# Patient Record
Sex: Female | Born: 1950 | Race: Black or African American | Hispanic: No | Smoking: Former smoker
Health system: Southern US, Community
[De-identification: ages and names within clinical notes are randomized; demographics above are authoritative.]

## PROBLEM LIST (undated history)

## (undated) ENCOUNTER — Emergency Department (HOSPITAL_COMMUNITY): Discharge status: Against Medical Advice | Payer: Commercial Managed Care - PPO

## (undated) DIAGNOSIS — I509 Heart failure, unspecified: Secondary | ICD-10-CM

## (undated) DIAGNOSIS — K5792 Diverticulitis of intestine, part unspecified, without perforation or abscess without bleeding: Secondary | ICD-10-CM

## (undated) DIAGNOSIS — N189 Chronic kidney disease, unspecified: Secondary | ICD-10-CM

## (undated) DIAGNOSIS — D649 Anemia, unspecified: Secondary | ICD-10-CM

## (undated) DIAGNOSIS — K219 Gastro-esophageal reflux disease without esophagitis: Secondary | ICD-10-CM

## (undated) DIAGNOSIS — I739 Peripheral vascular disease, unspecified: Secondary | ICD-10-CM

## (undated) DIAGNOSIS — M1990 Unspecified osteoarthritis, unspecified site: Secondary | ICD-10-CM

## (undated) DIAGNOSIS — K5900 Constipation, unspecified: Secondary | ICD-10-CM

## (undated) DIAGNOSIS — K5790 Diverticulosis of intestine, part unspecified, without perforation or abscess without bleeding: Secondary | ICD-10-CM

## (undated) DIAGNOSIS — I10 Essential (primary) hypertension: Secondary | ICD-10-CM

## (undated) HISTORY — PX: HERNIA REPAIR: SHX51

## (undated) HISTORY — PX: TUBAL LIGATION: SHX77

## (undated) HISTORY — DX: Anemia, unspecified: D64.9

## (undated) HISTORY — PX: OTHER SURGICAL HISTORY: SHX169

## (undated) HISTORY — PX: CARDIAC CATHETERIZATION: SHX172

## (undated) HISTORY — DX: Unspecified osteoarthritis, unspecified site: M19.90

## (undated) HISTORY — DX: Diverticulitis of intestine, part unspecified, without perforation or abscess without bleeding: K57.92

## (undated) HISTORY — DX: Diverticulosis of intestine, part unspecified, without perforation or abscess without bleeding: K57.90

## (undated) HISTORY — PX: POLYPECTOMY: SHX149

## (undated) HISTORY — PX: ABDOMINAL HYSTERECTOMY: SHX81

## (undated) HISTORY — PX: COLONOSCOPY: SHX174

## (undated) HISTORY — DX: Chronic kidney disease, unspecified: N18.9

## (undated) HISTORY — DX: Constipation, unspecified: K59.00

## (undated) HISTORY — DX: Heart failure, unspecified: I50.9

---

## 1999-03-21 ENCOUNTER — Emergency Department (HOSPITAL_COMMUNITY): Admission: EM | Admit: 1999-03-21 | Discharge: 1999-03-21 | Payer: Self-pay | Admitting: Emergency Medicine

## 2000-04-20 ENCOUNTER — Emergency Department (HOSPITAL_COMMUNITY): Admission: EM | Admit: 2000-04-20 | Discharge: 2000-04-20 | Payer: Self-pay | Admitting: Emergency Medicine

## 2000-05-06 ENCOUNTER — Ambulatory Visit (HOSPITAL_COMMUNITY): Admission: RE | Admit: 2000-05-06 | Discharge: 2000-05-06 | Payer: Self-pay | Admitting: Internal Medicine

## 2000-05-06 ENCOUNTER — Encounter: Payer: Self-pay | Admitting: Internal Medicine

## 2000-05-10 ENCOUNTER — Encounter: Admission: RE | Admit: 2000-05-10 | Discharge: 2000-05-10 | Payer: Self-pay | Admitting: Internal Medicine

## 2000-05-10 ENCOUNTER — Encounter: Payer: Self-pay | Admitting: Internal Medicine

## 2002-07-11 ENCOUNTER — Encounter: Payer: Self-pay | Admitting: Internal Medicine

## 2002-07-11 ENCOUNTER — Encounter: Admission: RE | Admit: 2002-07-11 | Discharge: 2002-07-11 | Payer: Self-pay | Admitting: Internal Medicine

## 2003-05-15 ENCOUNTER — Emergency Department (HOSPITAL_COMMUNITY): Admission: EM | Admit: 2003-05-15 | Discharge: 2003-05-15 | Payer: Self-pay | Admitting: Emergency Medicine

## 2003-05-15 ENCOUNTER — Encounter: Payer: Self-pay | Admitting: Emergency Medicine

## 2004-01-08 ENCOUNTER — Emergency Department (HOSPITAL_COMMUNITY): Admission: EM | Admit: 2004-01-08 | Discharge: 2004-01-08 | Payer: Self-pay | Admitting: Emergency Medicine

## 2004-04-09 ENCOUNTER — Emergency Department (HOSPITAL_COMMUNITY): Admission: EM | Admit: 2004-04-09 | Discharge: 2004-04-10 | Payer: Self-pay | Admitting: Emergency Medicine

## 2004-09-26 ENCOUNTER — Emergency Department (HOSPITAL_COMMUNITY): Admission: EM | Admit: 2004-09-26 | Discharge: 2004-09-26 | Payer: Self-pay | Admitting: Family Medicine

## 2005-04-09 ENCOUNTER — Emergency Department (HOSPITAL_COMMUNITY): Admission: EM | Admit: 2005-04-09 | Discharge: 2005-04-09 | Payer: Self-pay | Admitting: Emergency Medicine

## 2005-08-10 ENCOUNTER — Emergency Department (HOSPITAL_COMMUNITY): Admission: EM | Admit: 2005-08-10 | Discharge: 2005-08-10 | Payer: Self-pay | Admitting: Emergency Medicine

## 2007-01-11 ENCOUNTER — Emergency Department (HOSPITAL_COMMUNITY): Admission: EM | Admit: 2007-01-11 | Discharge: 2007-01-11 | Payer: Self-pay | Admitting: Emergency Medicine

## 2007-08-18 ENCOUNTER — Emergency Department (HOSPITAL_COMMUNITY): Admission: EM | Admit: 2007-08-18 | Discharge: 2007-08-18 | Payer: Self-pay | Admitting: Family Medicine

## 2008-05-21 ENCOUNTER — Emergency Department (HOSPITAL_COMMUNITY): Admission: EM | Admit: 2008-05-21 | Discharge: 2008-05-21 | Payer: Self-pay | Admitting: Family Medicine

## 2008-07-24 ENCOUNTER — Ambulatory Visit: Payer: Self-pay | Admitting: Family Medicine

## 2008-07-24 ENCOUNTER — Ambulatory Visit: Payer: Self-pay | Admitting: Internal Medicine

## 2008-07-24 DIAGNOSIS — E785 Hyperlipidemia, unspecified: Secondary | ICD-10-CM

## 2008-07-24 DIAGNOSIS — Z8719 Personal history of other diseases of the digestive system: Secondary | ICD-10-CM

## 2008-07-24 DIAGNOSIS — I10 Essential (primary) hypertension: Secondary | ICD-10-CM | POA: Insufficient documentation

## 2008-07-24 DIAGNOSIS — K5730 Diverticulosis of large intestine without perforation or abscess without bleeding: Secondary | ICD-10-CM | POA: Insufficient documentation

## 2008-07-24 DIAGNOSIS — F3289 Other specified depressive episodes: Secondary | ICD-10-CM | POA: Insufficient documentation

## 2008-07-24 DIAGNOSIS — R079 Chest pain, unspecified: Secondary | ICD-10-CM

## 2008-07-24 DIAGNOSIS — D509 Iron deficiency anemia, unspecified: Secondary | ICD-10-CM

## 2008-07-24 DIAGNOSIS — F329 Major depressive disorder, single episode, unspecified: Secondary | ICD-10-CM

## 2008-07-24 DIAGNOSIS — R9431 Abnormal electrocardiogram [ECG] [EKG]: Secondary | ICD-10-CM

## 2008-07-24 DIAGNOSIS — R21 Rash and other nonspecific skin eruption: Secondary | ICD-10-CM

## 2008-07-24 LAB — CONVERTED CEMR LAB
ALT: 22 units/L (ref 0–35)
AST: 18 units/L (ref 0–37)
Albumin: 4 g/dL (ref 3.5–5.2)
BUN: 13 mg/dL (ref 6–23)
Basophils Absolute: 0.1 10*3/uL (ref 0.0–0.1)
Bilirubin Urine: NEGATIVE
Bilirubin, Direct: 0.1 mg/dL (ref 0.0–0.3)
CO2: 30 meq/L (ref 19–32)
Calcium: 9.6 mg/dL (ref 8.4–10.5)
Chloride: 107 meq/L (ref 96–112)
Creatinine, Ser: 1.1 mg/dL (ref 0.4–1.2)
Eosinophils Absolute: 0.2 10*3/uL (ref 0.0–0.7)
Eosinophils Relative: 3.3 % (ref 0.0–5.0)
GFR calc non Af Amer: 54 mL/min
HCT: 42.6 % (ref 36.0–46.0)
Hemoglobin: 14.5 g/dL (ref 12.0–15.0)
Ketones, ur: NEGATIVE mg/dL
Lymphocytes Relative: 34.2 % (ref 12.0–46.0)
MCHC: 33.9 g/dL (ref 30.0–36.0)
MCV: 90.6 fL (ref 78.0–100.0)
Monocytes Absolute: 0.2 10*3/uL (ref 0.1–1.0)
Neutro Abs: 3.7 10*3/uL (ref 1.4–7.7)
Nitrite: NEGATIVE
Platelets: 238 10*3/uL (ref 150–400)
Potassium: 4.7 meq/L (ref 3.5–5.1)
RBC: 4.71 M/uL (ref 3.87–5.11)
TSH: 1.23 microintl units/mL (ref 0.35–5.50)
Total Bilirubin: 0.7 mg/dL (ref 0.3–1.2)
Total Protein: 7.5 g/dL (ref 6.0–8.3)
Triglycerides: 103 mg/dL (ref 0–149)
VLDL: 21 mg/dL (ref 0–40)
pH: 6 (ref 5.0–8.0)

## 2008-08-01 ENCOUNTER — Ambulatory Visit: Payer: Self-pay | Admitting: Internal Medicine

## 2008-08-01 ENCOUNTER — Inpatient Hospital Stay (HOSPITAL_COMMUNITY): Admission: EM | Admit: 2008-08-01 | Discharge: 2008-08-02 | Payer: Self-pay | Admitting: *Deleted

## 2008-08-01 ENCOUNTER — Ambulatory Visit: Payer: Self-pay | Admitting: Cardiology

## 2008-08-05 ENCOUNTER — Ambulatory Visit: Payer: Self-pay | Admitting: Internal Medicine

## 2008-08-05 DIAGNOSIS — K219 Gastro-esophageal reflux disease without esophagitis: Secondary | ICD-10-CM | POA: Insufficient documentation

## 2008-08-05 DIAGNOSIS — R609 Edema, unspecified: Secondary | ICD-10-CM

## 2009-01-21 ENCOUNTER — Ambulatory Visit: Payer: Self-pay | Admitting: Internal Medicine

## 2009-01-21 DIAGNOSIS — M549 Dorsalgia, unspecified: Secondary | ICD-10-CM | POA: Insufficient documentation

## 2009-02-27 ENCOUNTER — Ambulatory Visit: Payer: Self-pay | Admitting: Gastroenterology

## 2009-02-27 DIAGNOSIS — K5900 Constipation, unspecified: Secondary | ICD-10-CM | POA: Insufficient documentation

## 2009-02-27 DIAGNOSIS — K922 Gastrointestinal hemorrhage, unspecified: Secondary | ICD-10-CM | POA: Insufficient documentation

## 2009-02-27 DIAGNOSIS — D126 Benign neoplasm of colon, unspecified: Secondary | ICD-10-CM

## 2009-02-27 HISTORY — DX: Benign neoplasm of colon, unspecified: D12.6

## 2009-03-26 ENCOUNTER — Ambulatory Visit: Payer: Self-pay | Admitting: Gastroenterology

## 2009-03-26 ENCOUNTER — Encounter: Payer: Self-pay | Admitting: Gastroenterology

## 2009-03-28 ENCOUNTER — Encounter: Payer: Self-pay | Admitting: Gastroenterology

## 2009-10-11 ENCOUNTER — Emergency Department (HOSPITAL_COMMUNITY): Admission: EM | Admit: 2009-10-11 | Discharge: 2009-10-11 | Payer: Self-pay | Admitting: Family Medicine

## 2011-01-12 NOTE — H&P (Signed)
NAMEBUFFEY, ZABINSKI             ACCOUNT NO.:  [id number]   MEDICAL RECORD NO.:  [id number]          PATIENT TYPE:  INP   LOCATION:  0101                         FACILITY:  [facility]   PHYSICIAN:  Michiel Cowboy, MDDATE OF BIRTH:  [birth year]   DATE OF ADMISSION:  07/31/2008  DATE OF DISCHARGE:                              HISTORY & PHYSICAL   PRIMARY CARE Toshi Ishii:  Dr. Jonny Ruiz.   CHIEF COMPLAINT:  Chest pain.   The patient is an 60 year old female with past medical history of  hyperlipidemia and tobacco abuse and family history of coronary artery  disease in both mother and father, who for the past few months has been  having worsening fatigue when she walks around and sometimes feels  presyncopal.  Her primary care doctor was going to get her set up for a  stress test.  She was supposed to have a stress test on August 11, 2008, but today when driving to work she noticed sudden chest pain which  radiated to her left neck and left arm.  She was not short of breath,  did not have palpitations.  No nausea, no vomiting, no diaphoresis.  This happened at rest.  She presented to the emergency department and  here was relieved with nitro and morphine, unsure which one actually  helped.  She did have some indigestion associated with this.  She never  really had a similar episode before.  Currently, she is chest pain free.  Her cardiac markers are unremarkable and EKG unremarkable, as well as  her chest x-ray.  [facility] hospitalist was called for further admission.   PAST MEDICAL HISTORY:  Significant for hyperlipidemia.   REVIEW OF SYSTEMS:  Negative except for as per HPI, otherwise 12 systems  were reviewed and negative.   SOCIAL HISTORY:  The patient still continues to smoke.  Does not use  drugs, does not drink alcohol.  Her work involves a lot of physical  activity.   FAMILY HISTORY:  Significant for mother in her 73s with coronary artery  disease and father died of a massive  heart attack at 59.  Of note, the  patient is 26 is now.   PHYSICAL EXAMINATION:  VITAL SIGNS:  Temperature 97.7, blood pressure  145/95, after nitro 111/76, heart rate 80, now down to 68, respirations  16, satting 96% on room air.  GENERAL:  The patient appears to be in no acute distress, obese female  laying down in bed.  HEENT:  Head nontraumatic.  Moist mucous membranes.  LUNGS:  Clear to auscultation bilaterally but somewhat distant breath  sounds and somewhat coarse.  HEART:  Regular rate and rhythm.  No  murmurs appreciated.  ABDOMEN:  Soft, nontender, nondistended.  Somewhat obese.  LOWER EXTREMITIES:  No clubbing, cyanosis or edema.  Cranial nerves II  through XII intact.  Strength 5/5 in all for extremities.   LABORATORY DATA:  White blood cell count 8.0, hemoglobin 12.8, sodium  140, potassium 3.8, creatinine 1.2, magnesium 2.2.  Cardiac enzymes  within normal limits.  D-dimer 0.28.  EKG showing slightly peaked T-  waves in  lead II, but otherwise unremarkable in normal sinus rhythm.  Chest x-ray showing no cardiopulmonary disease, but this is age advanced  arthrosclerosis.   ASSESSMENT/PLAN:  This is a 60 year old female with history of  hyperlipidemia, tobacco abuse and family history of coronary artery  disease who presents with fairly typical chest pain.  Will admit on  telemetry, cycle cardiac enzymes.  D-dimer is negative.  Obtain further  risk stratify, fasting lipid panel, hemoglobin A1c and TSH.  Would  recommend cardiology consult in a.m. and possibly getting her stress  test done sooner or later.  Will continue aspirin.  Continue statin.  If  cardiac markers are elevated will start on heparin, but currently chest  pain free and very stable.   1. History of hyperlipidemia.  Continue statin, check fasting lipid      panel.  2. Prophylaxis.  Protonix and Lovenox.  3. Tobacco abuse.  Consult on quitting.  The patient seemed      interested.      Michiel Cowboy, MD  Electronically Signed     AVD/MEDQ  D:  08/01/2008  T:  08/01/2008  Job:  [id number]   cc:   Jonny Ruiz, MD

## 2011-01-12 NOTE — Consult Note (Signed)
Kristy Baxter, GRANDMAISON             ACCOUNT NO.:  [id number]   MEDICAL RECORD NO.:  [id number]          PATIENT TYPE:  INP   LOCATION:  0101                         FACILITY:  [facility]   PHYSICIAN:  Marca Ancona, MD      DATE OF BIRTH:  11/04/1950   DATE OF CONSULTATION:  08/01/2008  DATE OF DISCHARGE:                                 CONSULTATION   REQUESTING PHYSICIAN:  Dr. Rene Paci.   PRIMARY CARDIOLOGIST:  Dr. Oliver Barre.   PRIMARY CARDIOLOGIST:  Primary cardiologist would be new; Dr. Marca Ancona.   REASON FOR CONSULTATION:  Chest pain.   HISTORY OF PRESENT ILLNESS:  This is a 60 year old African American  female without prior history of CAD who has been recently diagnosed  within the last month with hypercholesterolemia who has been newly  established with Dr. Oliver Barre within this last month who had some  complaints of chest discomfort with associated diaphoresis while at work  on Saturday evening approximately 4 days ago.  The patient said she felt  it when she was walking and having substernal chest pressure that felt  like someone standing on me lasting seconds.  The patient states that  the pain went away with rest.  She also has been complaining of 3-4  months of increasing fatigue and lack of energy.  Last night while at  work the patient states that she had severe chest pressure with  radiation to the left shoulder and arm lasting approximately 2 hours  with increasing intensity.  The patient drove herself to the emergency  room, and was given sublingual nitroglycerin and aspirin and the pain  got some better and morphine relieved it completely.  The patient states  that when she saw Dr. Oliver Barre approximately 3 weeks ago she was  scheduled for a stress Myoview and an echocardiogram on August 12, 2008 secondary to some fatigue and recurrence of discomfort, although it  was not as intense as occurred last night.  The patient is now being  seen in the  emergency room for these issues.  Of note, the patient's  cardiac enzymes were found to be negative x2.  Her EKG was negative for  any acute changes.   REVIEW OF SYSTEMS:  Positive for complaints of lower extremity edema,  positive chest pain, diaphoresis not always with chest pain, but  associated with fatigue, weakness, and complaints of intermittent  claudication.   PAST MEDICAL HISTORY:  Newly diagnosed within this last month with  hypercholesterolemia.   PAST SURGICAL HISTORY:  Hysterectomy.   SOCIAL HISTORY:  Positive for tobacco, 1/3 pack a day for 25 years,  negative for EtOH.  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   FAMILY HISTORY:  Father deceased of an MI at age 62.  Mother with  cardiac issues to include CHF.  She is currently living.  She has 1  sister with lung cancer.   CURRENT MEDICATIONS:  1. Protonix 40 mg daily.  2. Simvastatin 80 mg daily.  3. Aspirin 81 mg daily.  4. Low molecular weight heparin.  5. Morphine p.r.n.   ALLERGIES:  1. SULFA.  2. BACTRIM.   Chest x-ray revealing no acute cardiopulmonary process.   CURRENT LABORATORIES:  Sodium 143, potassium 4.0, chloride 108, CO2 29,  BUN 13, creatinine 1.2, glucose 92.  D-dimer less than 0.28. Hemoglobin  12.0, hematocrit 36.6, white blood cells 6.7, platelets 243.  Total  cholesterol 187, total triglycerides 65, HDL 36, LDL 136.  PT 12.8, INR  1.0.  Troponin less than 0.01 and 0.01, respectively.   EKG on admission:  Sinus rhythm, ventricular rate of 80 beats per  minute.  This a.m. it is sinus brady with ventricular rate of 56 beats  per minute with no acute changes at present.   PHYSICAL EXAMINATION:  She is awake, alert, oriented, no acute distress.  She is pain free.  HEENT:  Head is normocephalic and atraumatic.  EYES:  PERRLA.  Mucous membranes and mouth are pink and moist.  Tongue  is midline.  NECK:  Supple.  There is no JVD.  No carotid bruits appreciated.   CARDIOVASCULAR:  Regular rate and rhythm with 1/6 systolic murmur  auscultated.  LUNGS:  Clear to auscultation without wheezes, rales or rhonchi.  ABDOMEN:  Soft, nontender, 2+ bowel sounds.  The patient has no lower extremity edema.  Dorsalis pedis pulses are 1+  bilaterally.  SKIN:  Warm and dry.  VITAL SIGNS:  Blood pressure 125/79, heart rate 68, respirations 15,  temperature 98.4, O2 sat 98% on room air.   IMPRESSION:  1. Chest pain worrisome for unstable angina.  Negative EKG and cardiac      enzymes.  The patient has multiple cardiovascular risk factors to      include smoking, family history, hypercholesterolemia, and      hypertension.  2. Recent diagnosis of hyperlipidemia.   PLAN:  After lengthy discussion with Dr. Marca Ancona, it has been  advised that the patient be transferred to [facility] for  cardiac catheterization in this setting.  This has been explained to the  patient who  verbalizes understanding.  Both risks and benefits have been discussed,  and she wishes to proceed.  At this time we will plan  transfer to [facility] for catheterization making further recommendations  depending upon results of cath and patient's response to treatment.      Bettey Mare. Lyman Bishop, NP      Marca Ancona, MD  Electronically Signed    KML/MEDQ  D:  08/01/2008  T:  08/01/2008  Job:  [id number]   cc:   Vikki Ports A. Felicity Coyer, MD  [address] [place], [place] [address]   Corwin Levins, MD  [address]  [place]  [place] [address]

## 2011-01-12 NOTE — Cardiovascular Report (Signed)
Kristy Baxter, Kristy Baxter             ACCOUNT NO.:  [id number]   MEDICAL RECORD NO.:  [id number]          PATIENT TYPE:  OUT   LOCATION:  CATH                         FACILITY:  [facility]   PHYSICIAN:  Verne Carrow, MDDATE OF BIRTH:  Oct 08, 1950   DATE OF PROCEDURE:  08/01/2008  DATE OF DISCHARGE:                            CARDIAC CATHETERIZATION   PROCEDURES PERFORMED:  1. Left heart catheterization.  2. Selective coronary angiography.  3. Left ventricular angiogram.   OPERATOR:  Verne Carrow, MD   INDICATIONS FOR PROCEDURE:  Chest pain in a patient with history of  tobacco abuse and hyperlipidemia.   DETAILS OF PROCEDURE:  The patient was brought to the inpatient cardiac  catheterization laboratory at [facility] after she was  transferred from [facility] for the procedure.  The patient  signed informed consent for the procedure prior to arriving in the cath  lab.  The right groin was prepped and draped in a sterile fashion.  A 1%  lidocaine was used for local anesthesia.  A 5-French sheath was inserted  into the right femoral artery without difficulty.  A 5-French JL-4  diagnostic catheter was used to selectively engage and inject the left  coronary system.  A 5-French JR-4 diagnostic catheter was used to  selectively engage and inject the right coronary artery.  A 5-French  pigtail catheter was used to cross the aortic valve into the left  ventricle.  Following the performance of the left ventricular angiogram,  the pigtail catheter was pulled back across the aortic valve with no  significant pressure gradient measured.  The patient tolerated the  procedure well.  I did perform an angiogram of her right femoral artery  following the procedure and found that the arterial stick was in an  appropriate location for a femoral artery closure device.  An Angio-Seal  femoral artery closure device was placed without difficulty in the right  femoral  artery.   ANGIOGRAPHIC FINDINGS:  The left main coronary artery bifurcated into  the circumflex and LAD.  There was no evidence of disease in the left  main system.  The left anterior descending coronary artery is a large  vessel that courses to the apex and gives off several diagonal branches  as well as a large septal perforator.  There is no angiographic evidence  of disease in the left anterior descending system.  The circumflex  artery is a large vessel that gives off a small first obtuse marginal  branch followed by a large branching second obtuse marginal and a  smaller third obtuse marginal.  There is no angiographic evidence of  disease in this system.  The right coronary artery is a large dominant  artery that gives off a posterior descending segment as well as a small  posterolateral branch.  There is no angiographic evidence of disease in  this system.   The left ventricular angiogram showed normal left ventricular systolic  function with no wall motion abnormalities.  Ejection fraction was  estimated at 55-60%.   HEMODYNAMIC DATA:  Left ventricular pressure 154/60, end-diastolic  pressure 12, central aortic pressure 151/79.  IMPRESSION:  1. No angiographic evidence of coronary artery disease.  2. Normal left ventricular systolic function.   RECOMMENDATIONS:  No further cardiac workup of chest pain.  The patient  will be transferred back to the [facility] for further care.      Verne Carrow, MD  Electronically Signed     CM/MEDQ  D:  08/01/2008  T:  08/02/2008  Job:  [id number]

## 2011-01-15 NOTE — Discharge Summary (Signed)
NAMEVIVI, Kristy Baxter             ACCOUNT NO.:  [id number]   MEDICAL RECORD NO.:  [id number]          PATIENT TYPE:  INP   LOCATION:  1409                         [facility]ACILITY:  [facility]   PHYSICIAN:  Valerie A. Felicity Coyer, MDDATE OF BIRTH:  [birth year]   DATE OF ADMISSION:  07/31/2008  DATE OF DISCHARGE:  08/02/2008                               DISCHARGE SUMMARY   DISCHARGE DIAGNOSES:  1. Chest pain with negative cardiac catheterization this admission.  2. Borderline hypertension.  3. Hypercholesterolemia.   HISTORY OF PRESENT ILLNESS:  The patient is a 60 year old female with  past medical history of hyperlipidemia and tobacco abuse who presented  to [facility] emergency room on day of admission with worsening fatigue  times several months being followed by her primary care physician.  On  the day of admission the patient reports the sudden onset of chest pain  radiating to her left neck and left arm.  She denied any associated  shortness of breath, nausea, vomiting or diaphoresis.  Again, the  symptoms occurred while the patient was at rest.  Given risk factors for  coronary artery disease the patient was admitted for further evaluation  and treatment.   PAST MEDICAL HISTORY:  1. Hyperlipidemia.  2. Current tobacco abuse.   CONSULTATIONS DURING THIS ADMISSION:  [place] cardiology.   PROCEDURES DURING THIS HOSPITALIZATION:  1. Cardiac catheterization done August 01, 2008 with no evidence of      coronary artery disease, normal left ventricular function.   COURSE OF HOSPITALIZATION:  1. Atypical chest pain.  Again, given the patient's risk factors      including hyperlipidemia, current tobacco abuse as well as early      onset coronary artery disease in both mother and father, cardiology      was asked to see the patient in consultation.  The patient did      undergo cardiac catheterization on August 01, 2008 with no      evidence of coronary artery disease and normal LV  systolic      function.  Please see complete cardiology consultation note for      further details.  No further cardiac workup of the patient's chest      pain is planned.  2. In regards to borderline hypertension throughout hospitalization we      will defer to the patient's primary care physician at follow-up to      determine need for medical management.   MEDICATIONS AT TIME OF DISCHARGE:  1. Aspirin 81 mg p.o. daily.  2. Simvastatin 80 mg p.o. daily.  3. Atrovent 1 tablet p.o. daily.   DISPOSITION:  The patient is felt medically stable for discharge home at  this time as cardiac catheterization within normal limits with no signs  of coronary artery disease.  The patient is instructed to follow up with  her primary care physician, Dr. Oliver Barre, on Monday, December 7 at  1:38 p.m. at which time blood pressure to be reassessed to determine  need for medication.      Cordelia Pen, NP      Raenette Rover. Felicity Coyer,  MD  Electronically Signed    LE/MEDQ  D:  09/11/2008  T:  09/12/2008  Job:  [id number]   cc:   Corwin Levins, MD  [address]  [place]  [place] [address]   Jearld Pies, Dr.

## 2011-05-31 LAB — POCT I-STAT, CHEM 8
BUN: 17
Chloride: 106
Creatinine, Ser: 1.3 — ABNORMAL HIGH
Hemoglobin: 15.3 — ABNORMAL HIGH
Potassium: 4.6

## 2011-06-04 ENCOUNTER — Inpatient Hospital Stay (INDEPENDENT_AMBULATORY_CARE_PROVIDER_SITE_OTHER)
Admission: RE | Admit: 2011-06-04 | Discharge: 2011-06-04 | Disposition: A | Discharge status: Home / Self Care | Payer: Commercial Managed Care - PPO | Source: Ambulatory Visit | Attending: Emergency Medicine | Admitting: Emergency Medicine

## 2011-06-04 DIAGNOSIS — M79609 Pain in unspecified limb: Secondary | ICD-10-CM

## 2011-06-04 LAB — LIPID PANEL
HDL: 36 mg/dL — ABNORMAL LOW (ref >39)
Total CHOL/HDL Ratio: 5.2 RATIO
Triglycerides: 65 mg/dL (ref <150)
VLDL: 13 mg/dL (ref 0–40)

## 2011-06-04 LAB — CARDIAC PANEL(CRET KIN+CKTOT+MB+TROPI)
CK, MB: 1.3 ng/mL (ref 0.3–4.0)
CK, MB: 1.5 ng/mL (ref 0.3–4.0)
CK, MB: 1.6 ng/mL (ref 0.3–4.0)
Relative Index: INVALID (ref 0.0–2.5)
Troponin I: 0.01 ng/mL (ref 0.00–0.06)
Troponin I: 0.02 ng/mL (ref 0.00–0.06)
Troponin I: 0.04 ng/mL (ref 0.00–0.06)

## 2011-06-04 LAB — BASIC METABOLIC PANEL
BUN: 13 mg/dL (ref 6–23)
BUN: 14 mg/dL (ref 6–23)
CO2: 25 mEq/L (ref 19–32)
Chloride: 108 mEq/L (ref 96–112)
Creatinine, Ser: 1.17 mg/dL (ref 0.4–1.2)
Creatinine, Ser: 1.19 mg/dL (ref 0.4–1.2)
Glucose, Bld: 101 mg/dL — ABNORMAL HIGH (ref 70–99)

## 2011-06-04 LAB — CBC
HCT: 38.9 % (ref 36.0–46.0)
Hemoglobin: 12.8 g/dL (ref 12.0–15.0)
MCHC: 32.7 g/dL (ref 30.0–36.0)
MCV: 90.5 fL (ref 78.0–100.0)
Platelets: 257 10*3/uL (ref 150–400)
RBC: 4.3 MIL/uL (ref 3.87–5.11)
RDW: 14.2 % (ref 11.5–15.5)
WBC: 6.2 10*3/uL (ref 4.0–10.5)

## 2011-06-04 LAB — COMPREHENSIVE METABOLIC PANEL
Albumin: 3.5 g/dL (ref 3.5–5.2)
BUN: 13 mg/dL (ref 6–23)
CO2: 29 mEq/L (ref 19–32)
Calcium: 9 mg/dL (ref 8.4–10.5)
Creatinine, Ser: 1.2 mg/dL (ref 0.4–1.2)
Glucose, Bld: 92 mg/dL (ref 70–99)
Sodium: 142 mEq/L (ref 135–145)
Total Bilirubin: 0.6 mg/dL (ref 0.3–1.2)

## 2011-06-04 LAB — TSH: TSH: 1.778 u[IU]/mL (ref 0.350–4.500)

## 2011-06-04 LAB — CK TOTAL AND CKMB (NOT AT ARMC)
CK, MB: 1.7 ng/mL (ref 0.3–4.0)
Total CK: 27 U/L (ref 7–177)

## 2011-06-04 LAB — APTT: aPTT: 28 seconds (ref 24–37)

## 2011-06-04 LAB — PROTIME-INR
INR: 0.9 (ref 0.00–1.49)
Prothrombin Time: 12.8 seconds (ref 11.6–15.2)

## 2014-01-28 ENCOUNTER — Emergency Department (HOSPITAL_COMMUNITY)
Admission: EM | Admit: 2014-01-28 | Discharge: 2014-01-28 | Discharge status: Against Medical Advice | Payer: Commercial Managed Care - PPO

## 2014-10-23 ENCOUNTER — Emergency Department (INDEPENDENT_AMBULATORY_CARE_PROVIDER_SITE_OTHER)
Admission: EM | Admit: 2014-10-23 | Discharge: 2014-10-23 | Disposition: A | Discharge status: Home / Self Care | Payer: Medicare Other | Source: Home / Self Care | Attending: Family Medicine | Admitting: Family Medicine

## 2014-10-23 ENCOUNTER — Encounter (HOSPITAL_COMMUNITY): Payer: Self-pay | Admitting: Emergency Medicine

## 2014-10-23 DIAGNOSIS — A084 Viral intestinal infection, unspecified: Secondary | ICD-10-CM | POA: Diagnosis not present

## 2014-10-23 MED ORDER — ONDANSETRON HCL 4 MG PO TABS: 4.0000 mg | ORAL_TABLET | Freq: Four times a day (QID) | ORAL | Status: DC

## 2014-10-23 NOTE — Discharge Instructions (Signed)
Viral Gastroenteritis Do not stop her diarrhea. As long and is continuing to lessen the allow her body to shed the virus. Zofran for nausea. Drink clear liquids in small frequent amounts. No heavy or solid foods for the next couple days. Viral gastroenteritis is also called stomach flu. This illness is caused by a certain type of germ (virus). It can cause sudden watery poop (diarrhea) and throwing up (vomiting). This can cause you to lose body fluids (dehydration). This illness usually lasts for 3 to 8 days. It usually goes away on its own. HOME CARE   Drink enough fluids to keep your pee (urine) clear or pale yellow. Drink small amounts of fluids often.  Ask your doctor how to replace body fluid losses (rehydration).  Avoid:  Foods high in sugar.  Alcohol.  Bubbly (carbonated) drinks.  Tobacco.  Juice.  Caffeine drinks.  Very hot or cold fluids.  Fatty, greasy foods.  Eating too much at one time.  Dairy products until 24 to 48 hours after your watery poop stops.  You may eat foods with active cultures (probiotics). They can be found in some yogurts and supplements.  Wash your hands well to avoid spreading the illness.  Only take medicines as told by your doctor. Do not give aspirin to children. Do not take medicines for watery poop (antidiarrheals).  Ask your doctor if you should keep taking your regular medicines.  Keep all doctor visits as told. GET HELP RIGHT AWAY IF:   You cannot keep fluids down.  You do not pee at least once every 6 to 8 hours.  You are short of breath.  You see blood in your poop or throw up. This may look like coffee grounds.  You have belly (abdominal) pain that gets worse or is just in one small spot (localized).  You keep throwing up or having watery poop.  You have a fever.  The patient is a child younger than 3 months, and he or she has a fever.  The patient is a child older than 3 months, and he or she has a fever and problems  that do not go away.  The patient is a child older than 3 months, and he or she has a fever and problems that suddenly get worse.  The patient is a baby, and he or she has no tears when crying. MAKE SURE YOU:   Understand these instructions.  Will watch your condition.  Will get help right away if you are not doing well or get worse. Document Released: 02/02/2008 Document Revised: 11/08/2011 Document Reviewed: 06/02/2011 [facility] Patient Information 2015 [place], [place]. This information is not intended to replace advice given to you by your health care provider. Make sure you discuss any questions you have with your health care provider.

## 2014-10-23 NOTE — ED Notes (Signed)
C/o abd pain onset 2 days Sx include intermittent sharp pains, n/v/d, chills Denies fevers, urinary sx Drinking gingerale w/some relief.  Alert, no signs of acute distress.

## 2014-10-23 NOTE — ED Provider Notes (Signed)
CSN: [id number]     Arrival date & time 10/23/14  1409 History   First MD Initiated Contact with Patient 10/23/14 1541     Chief Complaint  Patient presents with  . Abdominal Pain   (Consider location/radiation/quality/duration/timing/severity/associated sxs/prior Treatment) HPI Comments: 64 year old female states that for today she has been having intermittent lower abdominal cramping. After she had cramping this would be followed by diarrhea which would temporarily relieved the cramping. It is intermittent. And today she feels a little better. In addition she has had nausea but no vomiting. She has been able to drink clear liquids without vomiting. Denies fever, headache, neck pain, chest pain or shortness of breath.   History reviewed. No pertinent past medical history. History reviewed. No pertinent past surgical history. No family history on file. History  Substance Use Topics  . Smoking status: Current Every Day Smoker  . Smokeless tobacco: Not on file  . Alcohol Use: No   OB History    No data available     Review of Systems  Constitutional: Positive for activity change and appetite change. Negative for fever.  HENT: Negative.   Respiratory: Negative.   Cardiovascular: Negative.   Gastrointestinal: Positive for nausea, abdominal pain and diarrhea. Negative for vomiting, constipation and abdominal distention.  Genitourinary: Negative.   Skin: Negative.   Neurological: Negative.     Allergies  Sulfonamide derivatives  Home Medications   Prior to Admission medications   Medication Sig Start Date End Date Taking? Authorizing Provider  HYDROcodone-acetaminophen (NORCO) 10-325 MG per tablet Take 1 tablet by mouth every 6 (six) hours as needed.   Yes Historical Provider, MD  ondansetron (ZOFRAN) 4 MG tablet Take 1 tablet (4 mg total) by mouth every 6 (six) hours. 10/23/14   Janne Napoleon, NP   BP 125/90 mmHg  Pulse 59  Temp(Src) 97.9 F (36.6 C) (Oral)  Resp 12 Physical  Exam  Constitutional: She is oriented to person, place, and time. She appears well-developed and well-nourished. No distress.  HENT:  Mouth/Throat: Oropharynx is clear and moist. No oropharyngeal exudate.  Eyes: Conjunctivae and EOM are normal.  Neck: Normal range of motion. Neck supple.  Cardiovascular: Normal rate, regular rhythm and normal heart sounds.   Pulmonary/Chest: Effort normal and breath sounds normal. No respiratory distress. She has no wheezes. She has no rales.  Abdominal: Soft. Bowel sounds are normal. She exhibits no distension and no mass. There is no tenderness. There is no rebound and no guarding.  Lymphadenopathy:    She has no cervical adenopathy.  Neurological: She is alert and oriented to person, place, and time. She exhibits normal muscle tone.  Skin: Skin is warm and dry.  Psychiatric: She has a normal mood and affect.  Nursing note and vitals reviewed.   ED Course  Procedures (including critical care time) Labs Review Labs Reviewed - No data to display  Imaging Review No results found.   MDM   1. Viral gastroenteritis    Do not stop her diarrhea. As long and is continuing to lessen the allow her body to shed the virus. Zofran for nausea. Drink clear liquids in small frequent amounts. No heavy or solid foods for the next couple days.     Janne Napoleon, NP 10/23/14 1622

## 2015-01-24 ENCOUNTER — Emergency Department (HOSPITAL_COMMUNITY)
Admission: EM | Admit: 2015-01-24 | Discharge: 2015-01-24 | Disposition: A | Discharge status: Home / Self Care | Payer: Medicare Other | Attending: Emergency Medicine | Admitting: Emergency Medicine

## 2015-01-24 ENCOUNTER — Encounter (HOSPITAL_COMMUNITY): Payer: Self-pay | Admitting: *Deleted

## 2015-01-24 ENCOUNTER — Encounter (HOSPITAL_COMMUNITY): Payer: Self-pay | Admitting: Physical Medicine and Rehabilitation

## 2015-01-24 ENCOUNTER — Emergency Department (HOSPITAL_COMMUNITY): Payer: Medicare Other

## 2015-01-24 ENCOUNTER — Emergency Department (INDEPENDENT_AMBULATORY_CARE_PROVIDER_SITE_OTHER)
Admission: EM | Admit: 2015-01-24 | Discharge: 2015-01-24 | Disposition: A | Discharge status: Nursing Home (Includes ICF) | Payer: Medicare Other | Source: Home / Self Care | Attending: Family Medicine | Admitting: Family Medicine

## 2015-01-24 DIAGNOSIS — R55 Syncope and collapse: Secondary | ICD-10-CM

## 2015-01-24 DIAGNOSIS — Z720 Tobacco use: Secondary | ICD-10-CM | POA: Insufficient documentation

## 2015-01-24 DIAGNOSIS — I10 Essential (primary) hypertension: Secondary | ICD-10-CM | POA: Diagnosis not present

## 2015-01-24 DIAGNOSIS — N289 Disorder of kidney and ureter, unspecified: Secondary | ICD-10-CM | POA: Diagnosis not present

## 2015-01-24 HISTORY — DX: Essential (primary) hypertension: I10

## 2015-01-24 LAB — POCT I-STAT, CHEM 8
BUN: 26 mg/dL — ABNORMAL HIGH (ref 6–20)
CHLORIDE: 106 mmol/L (ref 101–111)
Calcium, Ion: 1.13 mmol/L (ref 1.13–1.30)
Creatinine, Ser: 1.7 mg/dL — ABNORMAL HIGH (ref 0.44–1.00)
GLUCOSE: 94 mg/dL (ref 65–99)
HEMATOCRIT: 45 % (ref 36.0–46.0)
HEMOGLOBIN: 15.3 g/dL — AB (ref 12.0–15.0)
Potassium: 4.2 mmol/L (ref 3.5–5.1)
Sodium: 140 mmol/L (ref 135–145)
TCO2: 23 mmol/L (ref 0–100)

## 2015-01-24 LAB — URINALYSIS, ROUTINE W REFLEX MICROSCOPIC
Bilirubin Urine: NEGATIVE
GLUCOSE, UA: NEGATIVE mg/dL
Hgb urine dipstick: NEGATIVE
Ketones, ur: NEGATIVE mg/dL
Leukocytes, UA: NEGATIVE
Nitrite: NEGATIVE
PH: 5 (ref 5.0–8.0)
Protein, ur: NEGATIVE mg/dL
SPECIFIC GRAVITY, URINE: 1.022 (ref 1.005–1.030)
Urobilinogen, UA: 0.2 mg/dL (ref 0.0–1.0)

## 2015-01-24 LAB — CBC WITH DIFFERENTIAL/PLATELET
BASOS ABS: 0.1 10*3/uL (ref 0.0–0.1)
BASOS PCT: 1 % (ref 0–1)
EOS ABS: 0.2 10*3/uL (ref 0.0–0.7)
Eosinophils Relative: 3 % (ref 0–5)
HCT: 41.4 % (ref 36.0–46.0)
HEMOGLOBIN: 13.5 g/dL (ref 12.0–15.0)
Lymphocytes Relative: 42 % (ref 12–46)
Lymphs Abs: 2.9 10*3/uL (ref 0.7–4.0)
MCH: 29.7 pg (ref 26.0–34.0)
MCHC: 32.6 g/dL (ref 30.0–36.0)
MCV: 91.2 fL (ref 78.0–100.0)
MONO ABS: 0.5 10*3/uL (ref 0.1–1.0)
Monocytes Relative: 7 % (ref 3–12)
NEUTROS ABS: 3.2 10*3/uL (ref 1.7–7.7)
NEUTROS PCT: 47 % (ref 43–77)
Platelets: 315 10*3/uL (ref 150–400)
RBC: 4.54 MIL/uL (ref 3.87–5.11)
RDW: 14.2 % (ref 11.5–15.5)
WBC: 6.8 10*3/uL (ref 4.0–10.5)

## 2015-01-24 LAB — COMPREHENSIVE METABOLIC PANEL
ALBUMIN: 4.2 g/dL (ref 3.5–5.0)
ALT: 15 U/L (ref 14–54)
ANION GAP: 12 (ref 5–15)
AST: 18 U/L (ref 15–41)
Alkaline Phosphatase: 88 U/L (ref 38–126)
BUN: 22 mg/dL — ABNORMAL HIGH (ref 6–20)
CO2: 24 mmol/L (ref 22–32)
CREATININE: 1.7 mg/dL — AB (ref 0.44–1.00)
Calcium: 9.4 mg/dL (ref 8.9–10.3)
Chloride: 105 mmol/L (ref 101–111)
GFR calc Af Amer: 35 mL/min — ABNORMAL LOW (ref >60)
GFR, EST NON AFRICAN AMERICAN: 30 mL/min — AB (ref >60)
Glucose, Bld: 97 mg/dL (ref 65–99)
POTASSIUM: 3.9 mmol/L (ref 3.5–5.1)
SODIUM: 141 mmol/L (ref 135–145)
TOTAL PROTEIN: 7.5 g/dL (ref 6.5–8.1)
Total Bilirubin: 0.2 mg/dL — ABNORMAL LOW (ref 0.3–1.2)

## 2015-01-24 LAB — I-STAT TROPONIN, ED: Troponin i, poc: 0 ng/mL (ref 0.00–0.08)

## 2015-01-24 MED ORDER — SODIUM CHLORIDE 0.9 % IV BOLUS (SEPSIS)
1000.0000 mL | Freq: Once | INTRAVENOUS | Status: AC
  Administered 2015-01-24: 1000 mL via INTRAVENOUS

## 2015-01-24 NOTE — ED Provider Notes (Signed)
CSN: [id number]     Arrival date & time 01/24/15  1140 History   First MD Initiated Contact with Patient 01/24/15 1434     Chief Complaint  Patient presents with  . Near Syncope     Patient is a 64 y.o. female presenting with near-syncope. The history is provided by the patient. No language interpreter was used.  Near Syncope   Ms. Hoggard is from urgent care for evaluation of near-syncope. She reports that she's had 2-3 weeks of feeling lightheaded at times. She describes that the dizziness that makes her feel like she will pass out. Symptoms are worse with ambulation. Symptoms are waxing and waning and she has may be 2-3 episodes daily. She does not pass out but feels off balance when these episodes began. She denies any fevers, chest pain, shortness of breath, abdominal pain, vomiting, diarrhea, numbness, weakness, headache. She has a history of hypertension but is no longer on any blood pressure medicines. She was seen in the urgent care today and referred to the ED for further evaluation. Symptoms are moderate, waxing and waning. She has chronic lower extremity edema that is unchanged from baseline.  Past Medical History  Diagnosis Date  . Hypertension    History reviewed. No pertinent past surgical history. No family history on file. History  Substance Use Topics  . Smoking status: Current Every Day Smoker    Types: Cigarettes  . Smokeless tobacco: Not on file  . Alcohol Use: No   OB History    No data available     Review of Systems  Cardiovascular: Positive for near-syncope.  All other systems reviewed and are negative.     Allergies  Sulfonamide derivatives  Home Medications   Prior to Admission medications   Medication Sig Start Date End Date Taking? Authorizing Provider  HYDROcodone-acetaminophen (NORCO) 10-325 MG per tablet Take 1 tablet by mouth every 6 (six) hours as needed.    Historical Provider, MD  ondansetron (ZOFRAN) 4 MG tablet Take 1 tablet (4 mg  total) by mouth every 6 (six) hours. 10/23/14   Janne Napoleon, NP   BP 124/67 mmHg  Pulse 64  Temp(Src) 98.7 F (37.1 C) (Oral)  Resp 18  SpO2 96% Physical Exam  Constitutional: She is oriented to person, place, and time. She appears well-developed and well-nourished.  HENT:  Head: Normocephalic and atraumatic.  Eyes: EOM are normal. Pupils are equal, round, and reactive to light.  Cardiovascular: Normal rate and regular rhythm.   No murmur heard. Pulmonary/Chest: Effort normal and breath sounds normal. No respiratory distress.  Abdominal: Soft. There is no tenderness. There is no rebound and no guarding.  Musculoskeletal: She exhibits no edema or tenderness.  2+ radial pulses bilaterally, 2+ DP pulses bilaterally.  Neurological: She is alert and oriented to person, place, and time. No cranial nerve deficit.  5/5 strength in all 4 extremities.  Skin: Skin is warm and dry.  Psychiatric: She has a normal mood and affect. Her behavior is normal.  Nursing note and vitals reviewed.   ED Course  Procedures (including critical care time) Labs Review Labs Reviewed  COMPREHENSIVE METABOLIC PANEL - Abnormal; Notable for the following:    BUN 22 (*)    Creatinine, Ser 1.70 (*)    Total Bilirubin 0.2 (*)    GFR calc non Af Amer 30 (*)    GFR calc Af Amer 35 (*)    All other components within normal limits  CBC WITH DIFFERENTIAL/PLATELET  URINALYSIS, ROUTINE  W REFLEX MICROSCOPIC (NOT AT [facility])  Randolm Idol, ED    Imaging Review Dg Chest 2 View  01/24/2015   CLINICAL DATA:  Near syncope and productive cough for 2 weeks  EXAM: CHEST  2 VIEW  COMPARISON:  July 31, 2008  FINDINGS: Lungs are clear. Heart size and pulmonary vascularity are normal. No adenopathy. There is atherosclerotic change in the aortic arch region. No bone lesions.  IMPRESSION: No edema or consolidation.   Electronically Signed   By: Lowella Grip III M.D.   On: 01/24/2015 12:15     EKG  Interpretation   Date/Time:  Friday Jan 24 2015 11:47:14 EDT Ventricular Rate:  68 PR Interval:  148 QRS Duration: 66 QT Interval:  408 QTC Calculation: 433 R Axis:   82 Text Interpretation:  Normal sinus rhythm Right atrial enlargement  Borderline ECG No significant change was found Confirmed by Wyvonnia Dusky  MD,  STEPHEN [phone]) on 01/24/2015 2:44:17 PM      MDM   Final diagnoses:  Near syncope  Acute renal insufficiency    Patient here for evaluation of intermittent lightheadedness and near syncopal type symptoms. She reports good oral intake and normal urination. She is nontoxic appearing on examination and well-perfused. History and presentation is not consistent with ACS, CHF, PE, CVA. BMP with acute renal insufficiency. Patient is mildly orthostatic.  Patient is nontoxic appearing examination. No evidence of serious bacterial infection. Providing IV fluids. Discussed close outpatient PCP follow-up and return precautions.  Quintella Reichert, MD 01/24/15 1640

## 2015-01-24 NOTE — Discharge Instructions (Signed)
You were dehydrated today in the Emergency Department.  Do not take any blood pressure medications.  Do not take ibuprofen, advil, or aleve.  Get rechecked if you develop any new or concerning symptoms.    Near-Syncope Near-syncope (commonly known as near fainting) is sudden weakness, dizziness, or feeling like you might pass out. During an episode of near-syncope, you may also develop pale skin, have tunnel vision, or feel sick to your stomach (nauseous). Near-syncope may occur when getting up after sitting or while standing for a long time. It is caused by a sudden decrease in blood flow to the brain. This decrease can result from various causes or triggers, most of which are not serious. However, because near-syncope can sometimes be a sign of something serious, a medical evaluation is required. The specific cause is often not determined. HOME CARE INSTRUCTIONS  Monitor your condition for any changes. The following actions may help to alleviate any discomfort you are experiencing:  Have someone stay with you until you feel stable.  Lie down right away and prop your feet up if you start feeling like you might faint. Breathe deeply and steadily. Wait until all the symptoms have passed. Most of these episodes last only a few minutes. You may feel tired for several hours.   Drink enough fluids to keep your urine clear or pale yellow.   If you are taking blood pressure or heart medicine, get up slowly when seated or lying down. Take several minutes to sit and then stand. This can reduce dizziness.  Follow up with your health care provider as directed. SEEK IMMEDIATE MEDICAL CARE IF:   You have a severe headache.   You have unusual pain in the chest, abdomen, or back.   You are bleeding from the mouth or rectum, or you have black or tarry stool.   You have an irregular or very fast heartbeat.   You have repeated fainting or have seizure-like jerking during an episode.   You faint  when sitting or lying down.   You have confusion.   You have difficulty walking.   You have severe weakness.   You have vision problems.  MAKE SURE YOU:   Understand these instructions.  Will watch your condition.  Will get help right away if you are not doing well or get worse. Document Released: 08/16/2005 Document Revised: 08/21/2013 Document Reviewed: 01/19/2013 [facility] Patient Information 2015 [place], [place]. This information is not intended to replace advice given to you by your health care provider. Make sure you discuss any questions you have with your health care provider. Acute Kidney Injury Acute kidney injury is a disease in which there is sudden (acute) damage to the kidneys. The kidneys are 2 organs that lie on either side of the spine between the middle of the back and the front of the abdomen. The kidneys:  Remove wastes and extra water from the blood.   Produce important hormones. These help keep bones strong, regulate blood pressure, and help create red blood cells.   Balance the fluids and chemicals in the blood and tissues. A small amount of kidney damage may not cause problems, but a large amount of damage may make it difficult or impossible for the kidneys to work the way they should. Acute kidney injury may develop into long-lasting (chronic) kidney disease. It may also develop into a life-threatening disease called end-stage kidney disease. Acute kidney injury can get worse very quickly, so it should be treated right away. Early treatment  may prevent other kidney diseases from developing.  CAUSES   A problem with blood flow to the kidneys. This may be caused by:   Blood loss.   Heart disease.   Severe burns.   Liver disease.  Direct damage to the kidneys. This may be caused by:  Some medicines.   A kidney infection.   Poisoning or consuming toxic substances.   A surgical wound.   A blow to the kidney area.   A problem with  urine flow. This may be caused by:   Cancer.   Kidney stones.   An enlarged prostate. SYMPTOMS   Swelling (edema) of the legs, ankles, or feet.   Tiredness (lethargy).   Nausea or vomiting.   Confusion.   Problems with urination, such as:   Painful or burning feeling during urination.   Decreased urine production.   Frequent accidents in children who are potty trained.   Bloody urine.   Muscle twitches and cramps.   Shortness of breath.   Seizures.   Chest pain or pressure. Sometimes, no symptoms are present. DIAGNOSIS Acute kidney injury may be detected and diagnosed by tests, including blood, urine, imaging, or kidney biopsy tests.  TREATMENT Treatment of acute kidney injury varies depending on the cause and severity of the kidney damage. In mild cases, no treatment may be needed. The kidneys may heal on their own. If acute kidney injury is more severe, your caregiver will treat the cause of the kidney damage, help the kidneys heal, and prevent complications from occurring. Severe cases may require a procedure to remove toxic wastes from the body (dialysis) or surgery to repair kidney damage. Surgery may involve:   Repair of a torn kidney.   Removal of an obstruction. Most of the time, you will need to stay overnight at the hospital.  HOME CARE INSTRUCTIONS:  Follow your prescribed diet.  Only take over-the-counter or prescription medicines as directed by your caregiver.  Do not take any new medicines (prescription, over-the-counter, or nutritional supplements) unless approved by your caregiver. Many medicines can worsen your kidney damage or need to have the dose adjusted.   Keep all follow-up appointments as directed by your caregiver.  Observe your condition to make sure you are healing as expected. SEEK IMMEDIATE MEDICAL CARE IF:  You are feeling ill or have severe pain in the back or side.   Your symptoms return or you have new  symptoms.  You have any symptoms of end-stage kidney disease. These include:   Persistent itchiness.   Loss of appetite.   Headaches.   Abnormally dark or light skin.  Numbness in the hands or feet.   Easy bruising.   Frequent hiccups.   Menstruation stops.   You have a fever.  You have increased urine production.  You have pain or bleeding when urinating. MAKE SURE YOU:   Understand these instructions.  Will watch your condition.  Will get help right away if you are not doing well or get worse Document Released: 03/01/2011 Document Revised: 12/11/2012 Document Reviewed: 04/14/2012 [facility] Patient Information 2015 [place], [place]. This information is not intended to replace advice given to you by your health care provider. Make sure you discuss any questions you have with your health care provider.

## 2015-01-24 NOTE — ED Notes (Signed)
Pt stable, ambulatory, states understanding of discharge instructions 

## 2015-01-24 NOTE — ED Provider Notes (Signed)
CSN: [id number]     Arrival date & time 01/24/15  6808 History   First MD Initiated Contact with Patient 01/24/15 1115     Chief Complaint  Patient presents with  . Dizziness   (Consider location/radiation/quality/duration/timing/severity/associated sxs/prior Treatment) Patient is a 64 y.o. female presenting with dizziness. The history is provided by the patient.  Dizziness Quality:  Lightheadedness Severity:  Moderate Duration:  3 weeks Progression:  Waxing and waning Chronicity:  Chronic Context comment:  Near syncope Associated symptoms: no blood in stool, no chest pain, no palpitations and no vomiting     Past Medical History  Diagnosis Date  . Hypertension    History reviewed. No pertinent past surgical history. History reviewed. No pertinent family history. History  Substance Use Topics  . Smoking status: Current Every Day Smoker  . Smokeless tobacco: Not on file  . Alcohol Use: No   OB History    No data available     Review of Systems  Constitutional: Negative.   HENT: Negative for ear pain.   Respiratory: Negative.   Cardiovascular: Negative for chest pain and palpitations.  Gastrointestinal: Negative for vomiting and blood in stool.  Genitourinary: Negative.   Neurological: Positive for dizziness.    Allergies  Sulfonamide derivatives  Home Medications   Prior to Admission medications   Medication Sig Start Date End Date Taking? Authorizing Provider  HYDROcodone-acetaminophen (NORCO) 10-325 MG per tablet Take 1 tablet by mouth every 6 (six) hours as needed.    Historical Provider, MD  ondansetron (ZOFRAN) 4 MG tablet Take 1 tablet (4 mg total) by mouth every 6 (six) hours. 10/23/14   Janne Napoleon, NP   BP 92/68 mmHg  Pulse 62  Temp(Src) 97.8 F (36.6 C) (Oral)  Resp 16  SpO2 98% Physical Exam  Constitutional: She is oriented to person, place, and time. She appears well-developed and well-nourished. No distress.  HENT:  Head: Normocephalic.   Right Ear: External ear normal.  Left Ear: External ear normal.  Mouth/Throat: Oropharynx is clear and moist.  Eyes: Conjunctivae and EOM are normal. Pupils are equal, round, and reactive to light.  Neck: Normal range of motion. Neck supple.  Cardiovascular: Normal rate, normal heart sounds and intact distal pulses.   Pulmonary/Chest: Effort normal and breath sounds normal.  Abdominal: Soft. Bowel sounds are normal. There is no tenderness.  Musculoskeletal: Normal range of motion.  Lymphadenopathy:    She has no cervical adenopathy.  Neurological: She is alert and oriented to person, place, and time.  Skin: Skin is warm and dry.  Nursing note and vitals reviewed.   ED Course  Procedures (including critical care time) Labs Review Labs Reviewed  POCT I-STAT, CHEM 8 - Abnormal; Notable for the following:    BUN 26 (*)    Creatinine, Ser 1.70 (*)    Hemoglobin 15.3 (*)    All other components within normal limits    Imaging Review No results found.   MDM   1. Near syncope    Sent for near syncopal episode at work this am, has had diziness for sev weeks, abnl ecg, hypotension, renal insuff.    Billy Fischer, MD 01/24/15 [phone]

## 2015-01-24 NOTE — ED Notes (Addendum)
Pt      Reports         Symptoms     Of   Dizzy        And   Wobbly           X  sev  Weeks   Pt  Reports  A  History  Of  Diabetes   But takes  No  meds    At  This  Time   Pt  States  She  Almost  Blacked  Out      at  This  Time   She  Is  Sitting  Upright on  The  Exam table  Speaking in  Complete  sentances    Skin  Is  Warm and  Dry  denys  Any  Chest pain or  Shortness  Of  Breath

## 2015-01-24 NOTE — ED Notes (Signed)
Pt presents to department for evaluation of dizziness and near syncope. Was seen at [facility] this morning and send to ED for further evaluation. Ongoing x2. Denies pain. Pt is alert and oriented x4. Respirations unlabored.

## 2015-07-01 ENCOUNTER — Emergency Department (INDEPENDENT_AMBULATORY_CARE_PROVIDER_SITE_OTHER)
Admission: EM | Admit: 2015-07-01 | Discharge: 2015-07-01 | Disposition: A | Discharge status: Home / Self Care | Payer: Medicare Other | Source: Home / Self Care | Attending: Emergency Medicine | Admitting: Emergency Medicine

## 2015-07-01 ENCOUNTER — Encounter (HOSPITAL_COMMUNITY): Payer: Self-pay | Admitting: Emergency Medicine

## 2015-07-01 ENCOUNTER — Other Ambulatory Visit (HOSPITAL_COMMUNITY)
Admission: RE | Admit: 2015-07-01 | Discharge: 2015-07-01 | Disposition: A | Discharge status: Home / Self Care | Payer: Medicare Other | Source: Ambulatory Visit | Attending: Emergency Medicine | Admitting: Emergency Medicine

## 2015-07-01 DIAGNOSIS — N390 Urinary tract infection, site not specified: Secondary | ICD-10-CM | POA: Insufficient documentation

## 2015-07-01 DIAGNOSIS — I159 Secondary hypertension, unspecified: Secondary | ICD-10-CM | POA: Insufficient documentation

## 2015-07-01 LAB — POCT URINALYSIS DIP (DEVICE)
GLUCOSE, UA: 250 mg/dL — AB
Nitrite: POSITIVE — AB
Protein, ur: >=300 mg/dL — AB
UROBILINOGEN UA: 4 mg/dL — AB (ref 0.0–1.0)
pH: 5 (ref 5.0–8.0)

## 2015-07-01 MED ORDER — NITROFURANTOIN MONOHYD MACRO 100 MG PO CAPS: 100.0000 mg | ORAL_CAPSULE | Freq: Two times a day (BID) | ORAL | Status: DC

## 2015-07-01 MED ORDER — PHENAZOPYRIDINE HCL 200 MG PO TABS: 200.0000 mg | ORAL_TABLET | Freq: Three times a day (TID) | ORAL | Status: DC | PRN

## 2015-07-01 NOTE — Discharge Instructions (Signed)
Decrease your salt intake. diet and exercise will lower your blood pressure significantly. It is important to keep your blood pressure under good control, as having a elevated for prolonged periods of time significantly increases your risk of stroke, heart attacks, kidney damage, eye damage, and other problems. Return here in a week for blood pressure recheck if you're able to find a primary care physician by then. Return immediately to the ER if you start having chest pain, headache, problems seeing, problems talking, problems walking, if you feel like you're about to pass out, if you do pass out, if you have a seizure, or for any other concerns.Marland Kitchen

## 2015-07-01 NOTE — ED Provider Notes (Signed)
HPI  SUBJECTIVE:  Kristy Baxter is a 64 y.o. female who presents with pt with dysuria, urinary urgency, frequency starting yesterday. She reports low abdominal midline pain described as pressure and mild bilateral low back pain. No cloudy, oderous urine . Symptoms better with azo, no aggravating factors. She has not tried anything else for this.No nausea, vomiting, fever,  No hematuria.  No other abdominal pain,   No vaginal bleeding or discharge/odor, vulvar itching, genital rash. Pt denies being sexually active- last contact "years" ago. No recent antibiotic. H/o UTI, HTN, questionable history of pyelonephritis. no history of diabetes. States this feels similar to previous UTIs.    Past Medical History  Diagnosis Date  . Hypertension     No past surgical history on file.  No family history on file.  Social History  Substance Use Topics  . Smoking status: Current Every Day Smoker    Types: Cigarettes  . Smokeless tobacco: None  . Alcohol Use: No    No current facility-administered medications for this encounter.  Current outpatient prescriptions:  .  nitrofurantoin, macrocrystal-monohydrate, (MACROBID) 100 MG capsule, Take 1 capsule (100 mg total) by mouth 2 (two) times daily. X 5 days, Disp: 10 capsule, Rfl: 0 .  phenazopyridine (PYRIDIUM) 200 MG tablet, Take 1 tablet (200 mg total) by mouth 3 (three) times daily as needed for pain., Disp: 6 tablet, Rfl: 0  Allergies  Allergen Reactions  . Sulfonamide Derivatives      ROS  As noted in HPI.   Physical Exam  BP 183/91 mmHg  Pulse 70  Temp(Src) 97.7 F (36.5 C) (Oral)  Resp 16  SpO2 100%  Constitutional: Well developed, well nourished, no acute distress Eyes: PERRL, EOMI, conjunctiva normal bilaterally HENT: Normocephalic, atraumatic,mucus membranes moist Respiratory: normal inspiratory effort Cardiovascular: Normal rate  GI: + suprapubic tenderness no flank tenderness, Soft, nondistended, normal bowel  sounds, no rebound, no guarding GU: deferred Back: no CVAT skin: No rash, skin intact Musculoskeletal: No edema, no tenderness, no deformities Neurologic: Alert & oriented x 3, CN II-XII grossly intact, no motor deficits, sensation grossly intact Psychiatric: Speech and behavior appropriate   ED Course   Medications - No data to display  Orders Placed This Encounter  Procedures  . Urine culture    Standing Status: Standing     Number of Occurrences: 1     Standing Expiration Date:     Order Specific Question:  List patient's active antibiotics    Answer:  macrobid  . POCT urinalysis dip (device)    Standing Status: Standing     Number of Occurrences: 1     Standing Expiration Date:    Results for orders placed or performed during the hospital encounter of 07/01/15 (from the past 24 hour(s))  POCT urinalysis dip (device)     Status: Abnormal   Collection Time: 07/01/15  1:25 PM  Result Value Ref Range   Glucose, UA 250 (A) NEGATIVE mg/dL   Bilirubin Urine SMALL (A) NEGATIVE   Ketones, ur TRACE (A) NEGATIVE mg/dL   Specific Gravity, Urine <=1.005 1.005 - 1.030   Hgb urine dipstick SMALL (A) NEGATIVE   pH 5.0 5.0 - 8.0   Protein, ur >=300 (A) NEGATIVE mg/dL   Urobilinogen, UA 4.0 (H) 0.0 - 1.0 mg/dL   Nitrite POSITIVE (A) NEGATIVE   Leukocytes, UA LARGE (A) NEGATIVE   No results found.  ED Clinical Impression  UTI (lower urinary tract infection)  Secondary hypertension, unspecified   ED Assessment/Plan  Urine dip consistent with UTI. Home with Macrobid, pyridium. Sending off urine culture to confirm antibiotic choice. Patient does not have a PCP will refer to [facility] for ongoing management/ routine healthcare.  Bp noted. Pt has no c/o such as severe headache, chest pain, shortness of breath, pain tearing through to her back, anuria, lower extremity edema. Advised patient to keep a log of her blood pressures and to return here in a week if she is unable  to follow up PCP [facility]. Will consider starting medication at that time Discussed labs,  MDM, plan and followup with patient. Discussed sn/sx that should prompt return to the UC or ED. Patient  agrees with plan.   *This clinic note was created using Dragon dictation software. Therefore, there may be occasional mistakes despite careful proofreading.  ?  Melynda Ripple, MD 07/01/15 [phone]

## 2015-07-01 NOTE — ED Notes (Signed)
C/o UTI sx onset 2 days associated w/urgency, abd and back pain Denies fevers, chills, n/v/d A&O x4... No acute distress.

## 2015-07-04 LAB — URINE CULTURE: Culture: >=100000

## 2015-07-04 NOTE — ED Notes (Signed)
Final report of urine C&S positive for E-Coli, sensitive to agent Rx day of visit to [facility]

## 2015-07-13 ENCOUNTER — Emergency Department (HOSPITAL_COMMUNITY)
Admission: EM | Admit: 2015-07-13 | Discharge: 2015-07-13 | Disposition: A | Discharge status: Home / Self Care | Payer: Medicare Other | Attending: Physician Assistant | Admitting: Physician Assistant

## 2015-07-13 ENCOUNTER — Emergency Department (HOSPITAL_COMMUNITY): Payer: Medicare Other

## 2015-07-13 ENCOUNTER — Encounter (HOSPITAL_COMMUNITY): Payer: Self-pay

## 2015-07-13 DIAGNOSIS — R109 Unspecified abdominal pain: Secondary | ICD-10-CM | POA: Diagnosis not present

## 2015-07-13 DIAGNOSIS — F17210 Nicotine dependence, cigarettes, uncomplicated: Secondary | ICD-10-CM | POA: Diagnosis not present

## 2015-07-13 DIAGNOSIS — M545 Low back pain: Secondary | ICD-10-CM | POA: Insufficient documentation

## 2015-07-13 DIAGNOSIS — Z87440 Personal history of urinary (tract) infections: Secondary | ICD-10-CM | POA: Diagnosis not present

## 2015-07-13 DIAGNOSIS — R112 Nausea with vomiting, unspecified: Secondary | ICD-10-CM | POA: Insufficient documentation

## 2015-07-13 DIAGNOSIS — R5383 Other fatigue: Secondary | ICD-10-CM | POA: Diagnosis not present

## 2015-07-13 DIAGNOSIS — R630 Anorexia: Secondary | ICD-10-CM | POA: Insufficient documentation

## 2015-07-13 DIAGNOSIS — I10 Essential (primary) hypertension: Secondary | ICD-10-CM | POA: Insufficient documentation

## 2015-07-13 DIAGNOSIS — M549 Dorsalgia, unspecified: Secondary | ICD-10-CM

## 2015-07-13 LAB — URINALYSIS, ROUTINE W REFLEX MICROSCOPIC
Bilirubin Urine: NEGATIVE
Glucose, UA: NEGATIVE mg/dL
Ketones, ur: NEGATIVE mg/dL
Nitrite: POSITIVE — AB
PROTEIN: NEGATIVE mg/dL
Specific Gravity, Urine: 1.011 (ref 1.005–1.030)
UROBILINOGEN UA: 0.2 mg/dL (ref 0.0–1.0)
pH: 7.5 (ref 5.0–8.0)

## 2015-07-13 LAB — LIPASE, BLOOD: Lipase: 22 U/L (ref 11–51)

## 2015-07-13 LAB — CBC WITH DIFFERENTIAL/PLATELET
BASOS PCT: 0 %
Basophils Absolute: 0 10*3/uL (ref 0.0–0.1)
Eosinophils Absolute: 0.1 10*3/uL (ref 0.0–0.7)
Eosinophils Relative: 1 %
HEMATOCRIT: 38.6 % (ref 36.0–46.0)
Hemoglobin: 12.2 g/dL (ref 12.0–15.0)
LYMPHS PCT: 5 %
Lymphs Abs: 0.6 10*3/uL — ABNORMAL LOW (ref 0.7–4.0)
MCH: 29.2 pg (ref 26.0–34.0)
MCHC: 31.6 g/dL (ref 30.0–36.0)
MCV: 92.3 fL (ref 78.0–100.0)
MONO ABS: 0.6 10*3/uL (ref 0.1–1.0)
MONOS PCT: 6 %
NEUTROS ABS: 10.2 10*3/uL — AB (ref 1.7–7.7)
Neutrophils Relative %: 88 %
Platelets: 278 10*3/uL (ref 150–400)
RBC: 4.18 MIL/uL (ref 3.87–5.11)
RDW: 14.4 % (ref 11.5–15.5)
WBC: 11.5 10*3/uL — ABNORMAL HIGH (ref 4.0–10.5)

## 2015-07-13 LAB — URINE MICROSCOPIC-ADD ON

## 2015-07-13 LAB — COMPREHENSIVE METABOLIC PANEL
ALT: 17 U/L (ref 14–54)
ANION GAP: 9 (ref 5–15)
AST: 17 U/L (ref 15–41)
Albumin: 3.7 g/dL (ref 3.5–5.0)
Alkaline Phosphatase: 76 U/L (ref 38–126)
BILIRUBIN TOTAL: 0.4 mg/dL (ref 0.3–1.2)
BUN: 11 mg/dL (ref 6–20)
CALCIUM: 8.8 mg/dL — AB (ref 8.9–10.3)
CO2: 26 mmol/L (ref 22–32)
Chloride: 105 mmol/L (ref 101–111)
Creatinine, Ser: 1.34 mg/dL — ABNORMAL HIGH (ref 0.44–1.00)
GFR calc Af Amer: 47 mL/min — ABNORMAL LOW (ref >60)
GFR, EST NON AFRICAN AMERICAN: 40 mL/min — AB (ref >60)
Glucose, Bld: 103 mg/dL — ABNORMAL HIGH (ref 65–99)
POTASSIUM: 4.1 mmol/L (ref 3.5–5.1)
Sodium: 140 mmol/L (ref 135–145)
Total Protein: 6.7 g/dL (ref 6.5–8.1)

## 2015-07-13 MED ORDER — DEXTROSE 5 % IV SOLN
1.0000 g | Freq: Once | INTRAVENOUS | Status: AC
  Administered 2015-07-13: 1 g via INTRAVENOUS
  Filled 2015-07-13: qty 10

## 2015-07-13 MED ORDER — KETOROLAC TROMETHAMINE 30 MG/ML IJ SOLN
30.0000 mg | Freq: Once | INTRAMUSCULAR | Status: AC
  Administered 2015-07-13: 30 mg via INTRAVENOUS
  Filled 2015-07-13: qty 1

## 2015-07-13 MED ORDER — SODIUM CHLORIDE 0.9 % IV BOLUS (SEPSIS)
1000.0000 mL | Freq: Once | INTRAVENOUS | Status: AC
Start: 2015-07-13 — End: 2015-07-13
  Administered 2015-07-13: 1000 mL via INTRAVENOUS

## 2015-07-13 MED ORDER — IBUPROFEN 800 MG PO TABS: 800.0000 mg | ORAL_TABLET | Freq: Three times a day (TID) | ORAL | Status: DC

## 2015-07-13 MED ORDER — ONDANSETRON HCL 4 MG PO TABS: 4.0000 mg | ORAL_TABLET | Freq: Three times a day (TID) | ORAL | Status: DC | PRN

## 2015-07-13 MED ORDER — CIPROFLOXACIN HCL 500 MG PO TABS: 500.0000 mg | ORAL_TABLET | Freq: Two times a day (BID) | ORAL | Status: DC

## 2015-07-13 MED ORDER — ONDANSETRON HCL 4 MG/2ML IJ SOLN
4.0000 mg | Freq: Once | INTRAMUSCULAR | Status: AC
  Administered 2015-07-13: 4 mg via INTRAVENOUS
  Filled 2015-07-13: qty 2

## 2015-07-13 NOTE — Discharge Instructions (Signed)
You have a urinary tract infection that is likely moved into your kidneys. You need to take this medication every day as needed. If you feel sick or are unable take the medication he returned immediately to the emergency department. We have attached a list of primary care physicians in the area including the number for wellness.   Wellness = [phone]   Emergency Department Resource Guide 1) Find a Doctor and Pay Out of Pocket Although you won't have to find out who is covered by your insurance plan, it is a good idea to ask around and get recommendations. You will then need to call the office and see if the doctor you have chosen will accept you as a new patient and what types of options they offer for patients who are self-pay. Some doctors offer discounts or will set up payment plans for their patients who do not have insurance, but you will need to ask so you aren't surprised when you get to your appointment.  2) Contact Your Local Health Department Not all health departments have doctors that can see patients for sick visits, but many do, so it is worth a call to see if yours does. If you don't know where your local health department is, you can check in your phone book. The CDC also has a tool to help you locate your state's health department, and many state websites also have listings of all of their local health departments.  3) Find a [facility] If your illness is not likely to be very severe or complicated, you may want to try a walk in clinic. These are popping up all over the country in pharmacies, drugstores, and shopping centers. They're usually staffed by nurse practitioners or physician assistants that have been trained to treat common illnesses and complaints. They're usually fairly quick and inexpensive. However, if you have serious medical issues or chronic medical problems, these are probably not your best option.  No Primary Care Doctor: - Call Health Connect at  [phone] -  they can help you locate a primary care doctor that  accepts your insurance, provides certain services, etc. - Physician Referral Service- [phone]  Chronic Pain Problems: Organization         Address  Phone   Notes  [facility]  [phone] Patients need to be referred by their primary care doctor.   Medication Assistance: Organization         Address  Phone   Notes  [facility] Medication [facility] [address] [place], [place] [address] [phone] --Must be a resident of [facility] -- Must have NO insurance coverage whatsoever (no Medicaid/ Medicare, etc.) -- The pt. MUST have a primary care doctor that directs their care regularly and follows them in the community   MedAssist  [phone]   [employer]  [phone]    Agencies that provide inexpensive medical care: Organization         Address  Phone   Notes  [facility]ellows  [phone]   [facility]    [phone]   [facility] [place], [place] [address] [phone]   [place] [address], [place] [phone]   Planned Parenthood    [phone]   [facility]    [phone]   [employer] [facility] [address] [place], [employer] Phone:  [phone], Fax:  [phone] Hours of Operation:  9 am - 6 pm, M-F.  Also accepts Medicaid/Medicare and self-pay.  [facility] for [address], [address], [place] Phone: [phone], Fax: [phone]. Hours of Operation:  8:30 am - 5:30 pm, M-F.  Also accepts Medicaid and self-pay.  [facility] [place] [address] [place] Phone: [phone]   [place], [place], [place] [phone], Ext. [phone] Mondays & Thursdays: 7-9 AM.  First 15 patients are seen on a first come, first serve basis.    [place] Providers:  Organization         Address  Phone   Notes  [facility] [address], [place] [phone] Also accepts self-pay patients.  [facility] [place] [address], [place]  [phone]   [place], [address], [place] [phone]   [facility] [address] [place] [phone]   Lucianne Lei [address], [place]   [phone] Only accepts [place] Access [place] patients after they have their name applied to their card.   Self-Pay (no insurance) in [facility]:  Organization         Address  Phone   Notes  Sickle Cell Patients, [facility] [place] [phone]   [facility] [place] [phone]   [facility] [place]  [place], [place], [place] [phone]   [facility]/Dr. Osei-Bonsu  [address], [place] or [address], [place] [phone] Phone number for both [place] and [place] locations is the same.  [facility]gent [facility] and [facility] [address], [place] [phone]   [facility] [address], [place] or [address] [phone]   [facility] [address] [place] [phone], phone; [phone], fax Sees patients 1st and 3rd Saturday of every month.  Must not qualify for public or private insurance (i.e. Medicaid, Medicare, [place] Health Choice, Veterans' Benefits)  Household income should be no more than 200% of the poverty level The clinic cannot treat you if you are pregnant or think you are pregnant  Sexually transmitted diseases are not treated at the clinic.    Dental Care: Organization         Address  Phone  Notes  [facility] Department of [facility] [place] [phone] Accepts children up to age 7 who are enrolled in [place] or [place]; pregnant women with a Medicaid card; and children who have applied for Medicaid or [place] Health Choice, but were declined, whose parents can pay a reduced fee at time of service.  [facility] Department of [facility]  [address], [place] [phone] Accepts children up to age 22 who are enrolled in [place] or [place]; pregnant women with a Medicaid card; and children who have applied for Medicaid or [place] Health Choice, but were declined, whose parents can pay a reduced fee at time of service.  [place] Adult Dental Access PROGRAM  [place] [phone] Patients are seen by appointment only. Walk-ins  are not accepted. [place] will see patients 57 years of age and older. Monday - Tuesday (8am-5pm) Most Wednesdays (8:30-5pm) $30 per visit, cash only  [facility] Adult [facility] Access PROGRAM  [address], [facility] [phone] Patients are seen by appointment only. Walk-ins are not accepted. [place] will see patients 68 years of age and older. One Wednesday Evening (Monthly: Volunteer Based).  $30 per visit, cash only  [place]  [phone] for adults; Children under age 80, call Graduate Pediatric Dentistry at [phone]. Children aged 24-14, please call [phone] to request a pediatric application.  Dental services are provided in all areas of dental care including fillings, crowns and bridges, complete and partial dentures, implants, gum treatment, root canals, and extractions. Preventive care is also provided. Treatment is provided to both adults and children. Patients are selected via a lottery and there is often a waiting list.   [facility] [address] [place]  [phone] [email or web address]   Rescue  [facility] [address] [place], [place] [phone], Ext. [phone] Second and Fourth Thursday of each month, opens at 6:30 AM; Clinic ends at 9 AM.  Patients are seen on a first-come first-served basis, and a limited number are seen during each clinic.   [facility]  [address] Hillard Danker [place], [place] [phone]   Eligibility Requirements You must have lived in [place], [place], or [place] counties for at least the last three months.   You cannot be eligible for state or federal sponsored [employer], including [employer], [place], or [employer].   You generally cannot be eligible for healthcare insurance through your employer.    How to apply: Eligibility screenings are held every Tuesday and Wednesday afternoon from 1:00 pm until 4:00 pm. You do not need an appointment for the interview!  [facility] [address], [place], [place]   [place]  [place] Department  [place]  [phone]    Behavioral Health Resources in the Community: Intensive Outpatient Programs Organization         Address  Phone  Notes  [place]. [address], [place], [place] [phone]   [facility] Outpatient [address], [place], [place]   ADS: Alcohol & Drug Svcs [address] [place], [place]   [place] [address],  [place], [place] or [phone]   Substance Abuse Resources Organization         Address  Phone  Notes  Alcohol and Drug Services  [phone]   [place]  [phone]   [facility]   Chinita Pester  [phone]   Residential & Outpatient Substance Abuse Program  [phone]   Psychological Services Organization         Address  Phone  Notes  [facility] [place]   [place]  [phone]   [place] [address], [place] [phone] or [phone]    Mobile Crisis Teams Organization         Address  Phone  Notes  [facility], Mobile Crisis Care Unit  [phone]   [facility]ive Psychotherapeutic Services  [address] [place], [place]   [facility] [address] [place] [phone]  Self-Help/Support Groups Organization         Address  Phone             Notes  Mental Health Assoc. of [place] - variety of support groups  [place] Call for more information  Narcotics Anonymous (NA), [facility] [address], [employer] [place]  2 meetings at this location   Special educational needs teacher         Address  Phone  Notes  [facility]idential [facility] [place],    [place]  [phone]   [facility]  [address], [place] [address], [place], [place]   [place], [place] [phone] Admissions: 8am-3pm M-F  Incentives Substance [place] [address],    [employer], [place] [phone]   [facility] [address] [place], [place], [place]   [facility] [address]  [place], [place]   [facility]ight Programs - Intensive Outpatient [address], [place], [place]   [facility] ([place] [place].,  [place], [place] [phone] or [phone]   [facility]idential Treatment Services (RTS) [address], [place], [place] Accepts Medicaid  [facility] [place] [address],  [place] [phone] Substance Abuse/Addiction Treatment   [facility] Organization         Address  Phone  Notes  [facility]  [phone]   Domenic Schwab, PhD [address] [place], [place]   [phone] or  [phone]   [place], [place] [phone]   [facility] [address], [place], [place] [phone] Insurance/Medicaid/sponsorship through [facility] and Families [address] [place]                                    [place], [place] [phone] [place] [address], [place] [phone]    Dr. Adele Schilder  [phone]   [facility] Dept. 1) [address], [place] 2) [place] 3)  [place] [address], [place] [phone]  [phone]   [place] [phone] or [phone] (After Hours)

## 2015-07-13 NOTE — ED Provider Notes (Signed)
CSN: [id number]     Arrival date & time 07/13/15  [id number] History   First MD Initiated Contact with Patient 07/13/15 0720     Chief Complaint  Patient presents with  . Back Pain  . Emesis     (Consider location/radiation/quality/duration/timing/severity/associated sxs/prior Treatment) HPI   Isn't a very pleasant 56 her old female presenting today with lower back pain and pain with urination. Patient was seen for this on November 3 by urgent care. She is found to have UTI and treated with antibiotics. She reports that a little better after that but then got critically worse in the last 2-3 days. It is been a gradual increase. Patient is febrile to 101.3. She's had mild nausea and vomiting. Patient reports bilateral flank pain.  Patient has no significant past history except for hysterectomy. Patient has no PCP at the moment.  Past Medical History  Diagnosis Date  . Hypertension    History reviewed. No pertinent past surgical history. History reviewed. No pertinent family history. Social History  Substance Use Topics  . Smoking status: Current Every Day Smoker    Types: Cigarettes  . Smokeless tobacco: None  . Alcohol Use: No   OB History    No data available     Review of Systems  Constitutional: Positive for fever, appetite change and fatigue. Negative for activity change.  HENT: Negative for congestion and drooling.   Eyes: Negative for discharge.  Respiratory: Negative for cough and chest tightness.   Cardiovascular: Negative for chest pain.  Gastrointestinal: Positive for nausea and vomiting. Negative for abdominal distention.  Genitourinary: Positive for flank pain. Negative for dysuria and difficulty urinating.  Musculoskeletal: Negative for joint swelling.  Skin: Negative for rash.  Allergic/Immunologic: Negative for immunocompromised state.  Psychiatric/Behavioral: Negative for behavioral problems and agitation.      Allergies  Sulfonamide derivatives  Home  Medications   Prior to Admission medications   Medication Sig Start Date End Date Taking? Authorizing Provider  nitrofurantoin, macrocrystal-monohydrate, (MACROBID) 100 MG capsule Take 1 capsule (100 mg total) by mouth 2 (two) times daily. X 5 days 07/01/15   Melynda Ripple, MD  phenazopyridine (PYRIDIUM) 200 MG tablet Take 1 tablet (200 mg total) by mouth 3 (three) times daily as needed for pain. 07/01/15   Melynda Ripple, MD   BP 134/80 mmHg  Pulse 100  Temp(Src) 101.3 F (38.5 C) (Oral)  Resp 16  SpO2 96% Physical Exam  Constitutional: She is oriented to person, place, and time. She appears well-developed and well-nourished.  Tearful on exam   HENT:  Head: Normocephalic and atraumatic.  Eyes: Conjunctivae are normal. Right eye exhibits no discharge.  Neck: Neck supple.  Cardiovascular: Normal rate, regular rhythm and normal heart sounds.   No murmur heard. Pulmonary/Chest: Effort normal and breath sounds normal. She has no wheezes. She has no rales.  Abdominal: Soft. She exhibits no distension. There is no tenderness.  CVA tenderness bilaterally   Musculoskeletal: Normal range of motion. She exhibits no edema.  Neurological: She is oriented to person, place, and time. No cranial nerve deficit.  Skin: Skin is warm and dry. No rash noted. She is not diaphoretic.  Psychiatric: She has a normal mood and affect. Her behavior is normal.  Nursing note and vitals reviewed.   ED Course  Procedures (including critical care time) Labs Review Labs Reviewed  URINE CULTURE  CBC WITH DIFFERENTIAL/PLATELET  COMPREHENSIVE METABOLIC PANEL  LIPASE, BLOOD  URINALYSIS, ROUTINE W REFLEX MICROSCOPIC (NOT AT [facility])  Imaging Review No results found. I have personally reviewed and evaluated these images and lab results as part of my medical decision-making.   EKG Interpretation   Date/Time:  Sunday July 13 2015 07:28:44 EST Ventricular Rate:  102 PR Interval:  143 QRS Duration:  74 QT Interval:  337 QTC Calculation: 439 R Axis:   72 Text Interpretation:  Sinus tachycardia Right atrial enlargement Since  last tracing rate faster no acute ischemia Confirmed by Thomasene Lot, [place]  [id number]) on 07/13/2015 7:46:20 AM      MDM   Final diagnoses:  Bilateral back pain      Pt is here with dysuria and bialteral flank pain ni the setting of fever. Pt recetly treated UTI 10 days ago, had gooten better now worse. Concern for pylo vs infected stone.  Will get labs. Give fluids, get UA, stone study. Give symptomatic care  11:02 AM  Patient found to have urinary tract infection. She feels much improved after fluids and ketorolac. She is eating and drinking normally, ambulatory. She has her sister's at bedside who will help take care of her home. She feels well enough to go home and I'll have her follow-up with her primary care physician. Patient understands return precautions.  Leopold Smyers Julio Alm, MD 07/13/15 1102

## 2015-07-13 NOTE — ED Notes (Signed)
Pt tolerated eating/drinking, EDP notified

## 2015-07-13 NOTE — ED Notes (Addendum)
Pt c/o chronic lower back pain, worse yesterday. Pain aggravated w/ movement. Pt supposed to see PCP at end of month but could not wait until then. Able to move all extremities w/o difficulty.  Pt also had on episode emesis last night.

## 2015-07-13 NOTE — ED Notes (Signed)
Patient transported to CT and X ray 

## 2015-07-15 LAB — URINE CULTURE: Culture: >=100000

## 2015-07-16 ENCOUNTER — Telehealth (HOSPITAL_BASED_OUTPATIENT_CLINIC_OR_DEPARTMENT_OTHER): Payer: Self-pay | Admitting: Emergency Medicine

## 2015-07-16 NOTE — Telephone Encounter (Signed)
Post ED Visit - Positive Culture Follow-up  Culture report reviewed by antimicrobial stewardship pharmacist:  []  Elenor Quinones, Pharm.D. []  Heide Guile, Pharm.D., BCPS [x]  Parks Neptune, Pharm.D. []  Alycia Rossetti, Pharm.D., BCPS []  [place], Pharm.D., BCPS, AAHIVP []  Legrand Como, Pharm.D., BCPS, AAHIVP []  Milus Glazier, Pharm.D. []  Stephens November, Pharm.D.  Positive urine culture E coli Treated with ciprofloxacin, organism sensitive to the same and no further patient follow-up is required at this time.  Hazle Nordmann 07/16/2015, 10:02 AM

## 2015-07-31 ENCOUNTER — Other Ambulatory Visit: Payer: Self-pay | Admitting: Internal Medicine

## 2015-07-31 DIAGNOSIS — E2839 Other primary ovarian failure: Secondary | ICD-10-CM

## 2015-08-07 ENCOUNTER — Encounter: Payer: Self-pay | Admitting: Gastroenterology

## 2015-09-24 ENCOUNTER — Other Ambulatory Visit: Payer: Self-pay

## 2015-09-24 ENCOUNTER — Ambulatory Visit
Admission: RE | Admit: 2015-09-24 | Discharge: 2015-09-24 | Disposition: A | Discharge status: Home / Self Care | Payer: Medicare Other | Source: Ambulatory Visit | Attending: Internal Medicine | Admitting: Internal Medicine

## 2015-09-24 DIAGNOSIS — Z1231 Encounter for screening mammogram for malignant neoplasm of breast: Secondary | ICD-10-CM

## 2015-09-24 DIAGNOSIS — E2839 Other primary ovarian failure: Secondary | ICD-10-CM

## 2015-10-03 ENCOUNTER — Ambulatory Visit
Admission: RE | Admit: 2015-10-03 | Discharge: 2015-10-03 | Disposition: A | Discharge status: Home / Self Care | Payer: Medicare Other | Source: Ambulatory Visit

## 2015-10-03 DIAGNOSIS — Z1231 Encounter for screening mammogram for malignant neoplasm of breast: Secondary | ICD-10-CM

## 2016-05-16 ENCOUNTER — Encounter (HOSPITAL_COMMUNITY): Payer: Self-pay | Admitting: Nurse Practitioner

## 2016-05-16 DIAGNOSIS — Z79899 Other long term (current) drug therapy: Secondary | ICD-10-CM | POA: Insufficient documentation

## 2016-05-16 DIAGNOSIS — I10 Essential (primary) hypertension: Secondary | ICD-10-CM | POA: Insufficient documentation

## 2016-05-16 DIAGNOSIS — F17210 Nicotine dependence, cigarettes, uncomplicated: Secondary | ICD-10-CM | POA: Diagnosis not present

## 2016-05-16 DIAGNOSIS — R1030 Lower abdominal pain, unspecified: Secondary | ICD-10-CM | POA: Insufficient documentation

## 2016-05-16 NOTE — ED Triage Notes (Signed)
Pt c/o abdominal pain 8/10, extensive GI hx, also c/o fever and chills.

## 2016-05-17 ENCOUNTER — Emergency Department (HOSPITAL_COMMUNITY)
Admission: EM | Admit: 2016-05-17 | Discharge: 2016-05-17 | Disposition: A | Discharge status: Home / Self Care | Payer: Medicare Other | Attending: Emergency Medicine | Admitting: Emergency Medicine

## 2016-05-17 DIAGNOSIS — R109 Unspecified abdominal pain: Secondary | ICD-10-CM

## 2016-05-17 DIAGNOSIS — R1030 Lower abdominal pain, unspecified: Secondary | ICD-10-CM

## 2016-05-17 LAB — COMPREHENSIVE METABOLIC PANEL
ALBUMIN: 4.3 g/dL (ref 3.5–5.0)
ALK PHOS: 75 U/L (ref 38–126)
ALT: 28 U/L (ref 14–54)
AST: 33 U/L (ref 15–41)
Anion gap: 7 (ref 5–15)
BUN: 23 mg/dL — ABNORMAL HIGH (ref 6–20)
CO2: 29 mmol/L (ref 22–32)
CREATININE: 1.78 mg/dL — AB (ref 0.44–1.00)
Calcium: 8.9 mg/dL (ref 8.9–10.3)
Chloride: 102 mmol/L (ref 101–111)
GFR calc non Af Amer: 28 mL/min — ABNORMAL LOW (ref >60)
GFR, EST AFRICAN AMERICAN: 33 mL/min — AB (ref >60)
GLUCOSE: 106 mg/dL — AB (ref 65–99)
Potassium: 3.8 mmol/L (ref 3.5–5.1)
SODIUM: 138 mmol/L (ref 135–145)
TOTAL PROTEIN: 7.8 g/dL (ref 6.5–8.1)
Total Bilirubin: 0.3 mg/dL (ref 0.3–1.2)

## 2016-05-17 LAB — CBC WITH DIFFERENTIAL/PLATELET
Basophils Absolute: 0 10*3/uL (ref 0.0–0.1)
Basophils Relative: 0 %
EOS ABS: 0 10*3/uL (ref 0.0–0.7)
EOS PCT: 0 %
HCT: 40.9 % (ref 36.0–46.0)
Hemoglobin: 13.3 g/dL (ref 12.0–15.0)
LYMPHS ABS: 1.6 10*3/uL (ref 0.7–4.0)
Lymphocytes Relative: 14 %
MCH: 29.5 pg (ref 26.0–34.0)
MCHC: 32.5 g/dL (ref 30.0–36.0)
MCV: 90.7 fL (ref 78.0–100.0)
MONO ABS: 0.8 10*3/uL (ref 0.1–1.0)
Monocytes Relative: 7 %
Neutro Abs: 8.9 10*3/uL — ABNORMAL HIGH (ref 1.7–7.7)
Neutrophils Relative %: 79 %
PLATELETS: 293 10*3/uL (ref 150–400)
RBC: 4.51 MIL/uL (ref 3.87–5.11)
RDW: 14.9 % (ref 11.5–15.5)
WBC: 11.4 10*3/uL — AB (ref 4.0–10.5)

## 2016-05-17 LAB — URINALYSIS, ROUTINE W REFLEX MICROSCOPIC
GLUCOSE, UA: NEGATIVE mg/dL
Ketones, ur: NEGATIVE mg/dL
Leukocytes, UA: NEGATIVE
Nitrite: NEGATIVE
PROTEIN: 30 mg/dL — AB
SPECIFIC GRAVITY, URINE: 1.031 — AB (ref 1.005–1.030)
pH: 5.5 (ref 5.0–8.0)

## 2016-05-17 LAB — URINE MICROSCOPIC-ADD ON

## 2016-05-17 LAB — LIPASE, BLOOD: Lipase: 16 U/L (ref 11–51)

## 2016-05-17 MED ORDER — METHOCARBAMOL 500 MG PO TABS: ORAL_TABLET | ORAL | 0 refills | Status: DC

## 2016-05-17 MED ORDER — KETOROLAC TROMETHAMINE 30 MG/ML IJ SOLN
30.0000 mg | Freq: Once | INTRAMUSCULAR | Status: AC
  Administered 2016-05-17: 30 mg via INTRAMUSCULAR
  Filled 2016-05-17: qty 1

## 2016-05-17 NOTE — ED Notes (Signed)
Pt was given ginger ale for po challenge. 

## 2016-05-17 NOTE — Discharge Instructions (Signed)
Use ice and heat on the painful areas. Take the robaxin with acetaminophen 650 mg 4 times a day. Recheck if you get a fever, the pain gets constant or worse, or you have vomiting or diarrhea.

## 2016-05-17 NOTE — ED Provider Notes (Signed)
[facility] DEPT Provider Note   CSN: [id number] Arrival date & time: 05/16/16  2229  By signing my name below, I, [name], attest that this documentation has been prepared under the direction and in the presence of Rolland Porter, MD . Electronically Signed: [name], Scribe. 05/17/2016. 1:31 AM.  Time seen 01:17 AM  History   Chief Complaint Chief Complaint  Patient presents with  . Abdominal Pain   The history is provided by the patient. No language interpreter was used.    HPI Comments:  Kristy Baxter is a 65 y.o. female with PMHx of HLD and HTN who presents to the Emergency Department complaining of  intermittent left sided flank pain beginning 3 days ago. She describes her symptoms as a dull pain that starts in her flank and radiates to her left front abdomen. Pt notes that her pain is alleviated slightly by laying down. When asked whether she has had similar pain before, pt states she has had many GU problems in the past and that her current symptoms could potentially be a flare-up. She endorses associated chills that resolved today, undocumented fever the prior 2 days and burping. She denies any nausea, vomiting, diarrhea, hematuria, dysuria or urinary frequency. Pt states that she hasn't been eating or drinking lately due to her symptoms. She is compliant with her blood pressure medication, amlodipine. Pt smokes 5 cigarettes a day, lives with her sons, and works as a Librarian, academic.  PCP: Dr. Jeanie Cooks, Internist: [phone]   Past Medical History:  Diagnosis Date  . Hypertension     Patient Active Problem List   Diagnosis Date Noted  . CONSTIPATION 02/27/2009  . GI BLEED 02/27/2009  . BACK PAIN, CHRONIC, INTERMITTENT 01/21/2009  . GERD 08/05/2008  . PERIPHERAL EDEMA 08/05/2008  . HYPERLIPIDEMIA 07/24/2008  . ANEMIA-IRON DEFICIENCY 07/24/2008  . DEPRESSION 07/24/2008  . HYPERTENSION 07/24/2008  . DIVERTICULOSIS, COLON 07/24/2008  . RASH-NONVESICULAR  07/24/2008  . CHEST PAIN 07/24/2008  . ELECTROCARDIOGRAM, ABNORMAL 07/24/2008  . DIVERTICULITIS, HX OF 07/24/2008    History reviewed. No pertinent surgical history.  OB History    No data available       Home Medications    Prior to Admission medications   Medication Sig Start Date End Date Taking? Authorizing Provider  acetaminophen (TYLENOL) 500 MG tablet Take 1,000 mg by mouth every 6 (six) hours as needed (for pain.).   Yes Historical Provider, MD  amLODipine (NORVASC) 5 MG tablet Take 5 mg by mouth daily. 04/14/16  Yes Historical Provider, MD  ibuprofen (ADVIL,MOTRIN) 200 MG tablet Take 200-400 mg by mouth every 6 (six) hours as needed (for pain.).   Yes Historical Provider, MD  methocarbamol (ROBAXIN) 500 MG tablet Take 1 or 2 po Q 6hrs for pain 05/17/16   Rolland Porter, MD    Family History History reviewed. No pertinent family history.  Social History Social History  Substance Use Topics  . Smoking status: Current Every Day Smoker    Packs/day: 0.50    Types: Cigarettes  . Smokeless tobacco: Never Used  . Alcohol use No  lives at home Lives with sons employed   Allergies   Sulfonamide derivatives   Review of Systems Review of Systems  Constitutional: Positive for chills and fever.  Gastrointestinal: Positive for abdominal pain. Negative for diarrhea, nausea and vomiting.       Positive for Burping  Genitourinary: Negative for dysuria, frequency and hematuria.  All other systems reviewed and are negative.  Physical Exam Updated Vital Signs BP 100/74 (BP Location: Right Arm)   Pulse 90   Temp 99 F (37.2 C) (Oral)   SpO2 98%   Vital signs normal with borderline BP   Physical Exam  Constitutional: She is oriented to person, place, and time. She appears well-developed and well-nourished.  Non-toxic appearance. She does not appear ill. No distress.  HENT:  Head: Normocephalic and atraumatic.  Right Ear: External ear normal.  Left Ear: External ear  normal.  Nose: Nose normal. No mucosal edema or rhinorrhea.  Mouth/Throat: Mucous membranes are normal. No dental abscesses or uvula swelling.  Eyes: Conjunctivae and EOM are normal. Pupils are equal, round, and reactive to light.  Neck: Normal range of motion and full passive range of motion without pain. Neck supple.  Cardiovascular: Normal rate, regular rhythm and normal heart sounds.  Exam reveals no gallop and no friction rub.   No murmur heard. Pulmonary/Chest: Effort normal and breath sounds normal. No respiratory distress. She has no wheezes. She has no rhonchi. She has no rales. She exhibits no tenderness and no crepitus.  Abdominal: Soft. Normal appearance and bowel sounds are normal. She exhibits no distension. There is no tenderness. There is no rebound and no guarding.  Musculoskeletal: Normal range of motion. She exhibits no edema or tenderness.  Left flank pain tenderness to palpation.  Neurological: She is alert and oriented to person, place, and time. She has normal strength. No cranial nerve deficit.  Skin: Skin is warm, dry and intact. No rash noted. No erythema. No pallor.  Psychiatric: She has a normal mood and affect. Her speech is normal and behavior is normal. Her mood appears not anxious.  Nursing note and vitals reviewed.    ED Treatments / Results  Labs (all labs ordered are listed, but only abnormal results are displayed) Results for orders placed or performed during the hospital encounter of 05/17/16  Lipase, blood  Result Value Ref Range   Lipase 16 11 - 51 U/L  Comprehensive metabolic panel  Result Value Ref Range   Sodium 138 135 - 145 mmol/L   Potassium 3.8 3.5 - 5.1 mmol/L   Chloride 102 101 - 111 mmol/L   CO2 29 22 - 32 mmol/L   Glucose, Bld 106 (H) 65 - 99 mg/dL   BUN 23 (H) 6 - 20 mg/dL   Creatinine, Ser 1.78 (H) 0.44 - 1.00 mg/dL   Calcium 8.9 8.9 - 10.3 mg/dL   Total Protein 7.8 6.5 - 8.1 g/dL   Albumin 4.3 3.5 - 5.0 g/dL   AST 33 15 - 41  U/L   ALT 28 14 - 54 U/L   Alkaline Phosphatase 75 38 - 126 U/L   Total Bilirubin 0.3 0.3 - 1.2 mg/dL   GFR calc non Af Amer 28 (L) >60 mL/min   GFR calc Af Amer 33 (L) >60 mL/min   Anion gap 7 5 - 15  Urinalysis, Routine w reflex microscopic  Result Value Ref Range   Color, Urine AMBER (A) YELLOW   APPearance CLOUDY (A) CLEAR   Specific Gravity, Urine 1.031 (H) 1.005 - 1.030   pH 5.5 5.0 - 8.0   Glucose, UA NEGATIVE NEGATIVE mg/dL   Hgb urine dipstick SMALL (A) NEGATIVE   Bilirubin Urine SMALL (A) NEGATIVE   Ketones, ur NEGATIVE NEGATIVE mg/dL   Protein, ur 30 (A) NEGATIVE mg/dL   Nitrite NEGATIVE NEGATIVE   Leukocytes, UA NEGATIVE NEGATIVE  CBC with Differential  Result Value Ref  Range   WBC 11.4 (H) 4.0 - 10.5 K/uL   RBC 4.51 3.87 - 5.11 MIL/uL   Hemoglobin 13.3 12.0 - 15.0 g/dL   HCT 40.9 36.0 - 46.0 %   MCV 90.7 78.0 - 100.0 fL   MCH 29.5 26.0 - 34.0 pg   MCHC 32.5 30.0 - 36.0 g/dL   RDW 14.9 11.5 - 15.5 %   Platelets 293 150 - 400 K/uL   Neutrophils Relative % 79 %   Neutro Abs 8.9 (H) 1.7 - 7.7 K/uL   Lymphocytes Relative 14 %   Lymphs Abs 1.6 0.7 - 4.0 K/uL   Monocytes Relative 7 %   Monocytes Absolute 0.8 0.1 - 1.0 K/uL   Eosinophils Relative 0 %   Eosinophils Absolute 0.0 0.0 - 0.7 K/uL   Basophils Relative 0 %   Basophils Absolute 0.0 0.0 - 0.1 K/uL  Urine microscopic-add on  Result Value Ref Range   Squamous Epithelial / LPF 0-5 (A) NONE SEEN   WBC, UA 0-5 0 - 5 WBC/hpf   RBC / HPF 0-5 0 - 5 RBC/hpf   Bacteria, UA RARE (A) NONE SEEN   Urine-Other LESS THAN 10 mL OF URINE SUBMITTED    Laboratory interpretation all normal except stable renal insufficiency, mild leukocytosis    EKG  EKG Interpretation None       Radiology No results found.  July 13, 2015 EXAM: CT ABDOMEN AND PELVIS WITHOUT CONTRAST  TECHNIQUE: Multidetector CT imaging of the abdomen and pelvis was performed following the standard protocol without IV  contrast.  COMPARISON:  04/09/2005  FINDINGS: Lung bases: Right greater than left lung base opacity, likely atelectasis. 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 Heart normal in size.  Liver: 11 mm low-density lesion in the right lobe, increased in size from the prior study, most likely a cyst. No other liver abnormality.  Spleen, gallbladder, pancreas, adrenal glands:  Unremarkable.  Kidneys, ureters, bladder: 6 mm low-density lesion in the posterior -medial aspect of the right kidney upper pole, consistent with cysts, mildly enlarged from the prior study. No other renal masses, no stones and no hydronephrosis. Normal ureters. Bladder is unremarkable.  Uterus and adnexa:  Uterus surgically absent.  No pelvic masses.  Lymph nodes: Mildly enlarged right inguinal node measuring 14 mm in short axis. This was present previously. No other adenopathy.  Ascites:  None.  Gastrointestinal: Scattered colonic diverticula. No diverticulitis. Colon otherwise unremarkable. Normal stomach and small bowel. Normal appendix.  Musculoskeletal: There are degenerative changes, relatively mild, the lower thoracic and lower lumbar spine. This is most evident at L4-L5 where there is an anterolisthesis significant facet degenerative change. No osteoblastic or osteolytic lesions.  IMPRESSION: 1. No acute findings. 2. No evidence of renal or ureteral stones or obstructive uropathy. 3. Small liver and right renal cysts. 4. Colonic diverticula without diverticulitis. 5. Status post hysterectomy. 6. Degenerative changes of the visualized spine as described.   Electronically Signed   By: Lajean Manes M.D.   On: 07/13/2015 09:03  Procedures Procedures (including critical care time)  Medications Ordered in ED Medications  ketorolac (TORADOL) 30 MG/ML injection 30 mg (not administered)     Initial Impression / Assessment and  Plan / ED Course  I have reviewed the triage vital signs and the nursing notes.  Pertinent labs & imaging results that were available during my care of the patient were reviewed by me and considered in my medical  decision making (see chart for details).  Clinical Course   COORDINATION OF CARE:  1:21 AM Discussed treatment plan with pt at bedside which included urinalysis and imaging and pt agreed to plan.  3:05 AM Discussed repeating CT scan which did not show kidney stones 10 months ago, or treating her for musculoskeletal back pain. If this doesn't help, reevaluate. She elected to be treated for musculoskeletal pain. She was given a one-time low dose injection of Toradol, she was discharged home on Robaxin and acetaminophen, she was not placed on nonsteroidal anti-inflammatory drugs due to her underlying renal insufficiency.   Final Clinical Impressions(s) / ED Diagnoses   Final diagnoses:  Acute left flank pain  Lower abdominal pain    New Prescriptions New Prescriptions   METHOCARBAMOL (ROBAXIN) 500 MG TABLET    Take 1 or 2 po Q 6hrs for pain  OTC acetaminophen  Plan discharge  Rolland Porter, MD, FACEP   I personally performed the services described in this documentation, which was scribed in my presence. The recorded information has been reviewed and considered.  Rolland Porter, MD, Barbette Or, MD 05/17/16 [phone]

## 2017-01-07 ENCOUNTER — Encounter (HOSPITAL_COMMUNITY): Payer: Self-pay | Admitting: *Deleted

## 2017-01-07 ENCOUNTER — Ambulatory Visit (HOSPITAL_COMMUNITY)
Admission: EM | Admit: 2017-01-07 | Discharge: 2017-01-07 | Disposition: A | Discharge status: Home / Self Care | Payer: Medicare HMO | Attending: Internal Medicine | Admitting: Internal Medicine

## 2017-01-07 DIAGNOSIS — Z882 Allergy status to sulfonamides status: Secondary | ICD-10-CM | POA: Insufficient documentation

## 2017-01-07 DIAGNOSIS — I10 Essential (primary) hypertension: Secondary | ICD-10-CM | POA: Insufficient documentation

## 2017-01-07 DIAGNOSIS — L03031 Cellulitis of right toe: Secondary | ICD-10-CM | POA: Diagnosis not present

## 2017-01-07 DIAGNOSIS — F17210 Nicotine dependence, cigarettes, uncomplicated: Secondary | ICD-10-CM | POA: Diagnosis not present

## 2017-01-07 DIAGNOSIS — M79674 Pain in right toe(s): Secondary | ICD-10-CM | POA: Diagnosis present

## 2017-01-07 LAB — URIC ACID: Uric Acid, Serum: 6.2 mg/dL (ref 2.3–6.6)

## 2017-01-07 MED ORDER — HYDROCODONE-ACETAMINOPHEN 5-325 MG PO TABS: 1.0000 | ORAL_TABLET | ORAL | 0 refills | Status: DC | PRN

## 2017-01-07 MED ORDER — CEPHALEXIN 500 MG PO CAPS: 500.0000 mg | ORAL_CAPSULE | Freq: Four times a day (QID) | ORAL | 0 refills | Status: DC

## 2017-01-07 NOTE — ED Triage Notes (Signed)
Pt  Ambulated  To  The  ucc   With   c/o  Pain  r  Small  Toe     Denies   Any  injury

## 2017-01-07 NOTE — Discharge Instructions (Signed)
Take the antibiotic as directed. It is important that she follow-up with a podiatrist very early next week. If you are unable to get an early appointment and the toe is not getting any better or getting worse, with increased swelling and pain and discoloration go to the emergency department for follow-up in case you may need surgical intervention.

## 2017-01-07 NOTE — ED Provider Notes (Signed)
CSN: [id number]     Arrival date & time 01/07/17  1458 History   First MD Initiated Contact with Patient 01/07/17 1640     Chief Complaint  Patient presents with  . Toe Pain   (Consider location/radiation/quality/duration/timing/severity/associated sxs/prior Treatment) 66 year old female complaining of gradual but crescendo pain to the right fifth toe over the past 2 days. She denies any type of trauma. She states is very tender and painful to walk on if she is using a shoe that allows flexion of the toes. Denies any history of gout.      Past Medical History:  Diagnosis Date  . Hypertension    History reviewed. No pertinent surgical history. History reviewed. No pertinent family history. Social History  Substance Use Topics  . Smoking status: Current Every Day Smoker    Packs/day: 0.50    Types: Cigarettes  . Smokeless tobacco: Never Used  . Alcohol use No   OB History    No data available     Review of Systems  Constitutional: Positive for activity change.  Musculoskeletal:       Right fifth toe pain and swelling as per history of present illness.  All other systems reviewed and are negative.   Allergies  Sulfonamide derivatives  Home Medications   Prior to Admission medications   Medication Sig Start Date End Date Taking? Authorizing Provider  acetaminophen (TYLENOL) 500 MG tablet Take 1,000 mg by mouth every 6 (six) hours as needed (for pain.).    [provider]  amLODipine (NORVASC) 5 MG tablet Take 5 mg by mouth daily. 04/14/16   [provider]  ibuprofen (ADVIL,MOTRIN) 200 MG tablet Take 200-400 mg by mouth every 6 (six) hours as needed (for pain.).    [provider]  methocarbamol (ROBAXIN) 500 MG tablet Take 1 or 2 po Q 6hrs for pain 05/17/16   Rolland Porter, MD   Meds Ordered and Administered this Visit  Medications - No data to display  BP (!) 139/99 (BP Location: Right Arm)   Pulse 78   Temp 98.6 F (37 C) (Oral)   Resp 18    SpO2 98%  No data found.   Physical Exam  Constitutional: She is oriented to person, place, and time. She appears well-developed and well-nourished. No distress.  Eyes: EOM are normal.  Neck: Neck supple.  Cardiovascular: Normal rate.   Pulmonary/Chest: Effort normal. No respiratory distress.  Musculoskeletal: She exhibits no edema.  Right fifth toe with swelling and tenderness, light erythema to the dorsal aspect of the fifth toe, a more purplish discoloration to the flexor aspect of the toe. There is tenderness over the MTP joint as well. No drainage or bleeding. The toe nail is deformed due to onychomycosis. No wounds are seen, no open skin lesions. The adjacent toes are unaffected. No swelling to the foot.  Neurological: She is alert and oriented to person, place, and time. She exhibits normal muscle tone.  Skin: Skin is warm and dry.  Psychiatric: She has a normal mood and affect.  Nursing note and vitals reviewed.   Urgent Care Course     Procedures (including critical care time)  Labs Review Labs Reviewed  URIC ACID    Imaging Review No results found.   Visual Acuity Review  Right Eye Distance:   Left Eye Distance:   Bilateral Distance:    Right Eye Near:   Left Eye Near:    Bilateral Near:  MDM   1. Cellulitis of small toe of right foot    Take the antibiotic as directed. It is important that she follow-up with a podiatrist very early next week. If you are unable to get an early appointment and the toe is not getting any better or getting worse, with increased swelling and pain and discoloration go to the emergency department for follow-up in case you may need surgical intervention. Meds ordered this encounter  Medications  . cephALEXin (KEFLEX) 500 MG capsule    Sig: Take 1 capsule (500 mg total) by mouth 4 (four) times daily.    Dispense:  28 capsule    Refill:  0    Order Specific Question:   Supervising Provider    Answer:   Sherlene Shams [[id number]]  . HYDROcodone-acetaminophen (NORCO/VICODIN) 5-325 MG tablet    Sig: Take 1 tablet by mouth every 4 (four) hours as needed. May cause drowsiness    Dispense:  15 tablet    Refill:  0    Order Specific Question:   Supervising Provider    Answer:   Sherlene Shams [[id number]]  post op shoe ruic acid pending      Janne Napoleon, NP 01/07/17 1705

## 2017-02-08 ENCOUNTER — Ambulatory Visit (HOSPITAL_COMMUNITY)
Admission: RE | Admit: 2017-02-08 | Discharge: 2017-02-08 | Disposition: A | Discharge status: Home / Self Care | Payer: Medicare HMO | Source: Ambulatory Visit | Attending: Vascular Surgery | Admitting: Vascular Surgery

## 2017-02-08 ENCOUNTER — Other Ambulatory Visit: Payer: Self-pay | Admitting: Internal Medicine

## 2017-02-08 DIAGNOSIS — Z720 Tobacco use: Secondary | ICD-10-CM | POA: Insufficient documentation

## 2017-02-08 DIAGNOSIS — I96 Gangrene, not elsewhere classified: Secondary | ICD-10-CM | POA: Insufficient documentation

## 2017-02-08 DIAGNOSIS — I10 Essential (primary) hypertension: Secondary | ICD-10-CM | POA: Diagnosis not present

## 2017-02-08 DIAGNOSIS — R938 Abnormal findings on diagnostic imaging of other specified body structures: Secondary | ICD-10-CM | POA: Insufficient documentation

## 2017-02-11 ENCOUNTER — Encounter: Payer: Self-pay | Admitting: Podiatry

## 2017-02-11 ENCOUNTER — Ambulatory Visit (INDEPENDENT_AMBULATORY_CARE_PROVIDER_SITE_OTHER): Payer: Medicare HMO | Admitting: Podiatry

## 2017-02-11 ENCOUNTER — Ambulatory Visit (INDEPENDENT_AMBULATORY_CARE_PROVIDER_SITE_OTHER): Payer: Medicare HMO

## 2017-02-11 VITALS — BP 120/69 | HR 57 | Resp 16

## 2017-02-11 DIAGNOSIS — B351 Tinea unguium: Secondary | ICD-10-CM

## 2017-02-11 DIAGNOSIS — L089 Local infection of the skin and subcutaneous tissue, unspecified: Secondary | ICD-10-CM | POA: Diagnosis not present

## 2017-02-11 DIAGNOSIS — M25471 Effusion, right ankle: Secondary | ICD-10-CM

## 2017-02-11 DIAGNOSIS — M79676 Pain in unspecified toe(s): Secondary | ICD-10-CM | POA: Diagnosis not present

## 2017-02-11 DIAGNOSIS — M79674 Pain in right toe(s): Secondary | ICD-10-CM

## 2017-02-11 DIAGNOSIS — S90821S Blister (nonthermal), right foot, sequela: Secondary | ICD-10-CM

## 2017-02-11 NOTE — Progress Notes (Signed)
   Subjective:    Patient ID: Kristy Baxter, female    DOB: 05/19/1951, 66 y.o.   MRN: [id number]  HPI this patient presents the office for an evaluation of her fifth toe right foot. She gives a history of having an infection in this fifth toe and she was treated with antibiotics twice, once by the urgent care center and one by her medical doctor, Dr.Albuvere. She presents the office today stating that she's had no problems with her toe. The last few days she has been previously treated with surgery for the removal of bone of the fifth toe of the right foot. Patient also had a bunion correction first MPJ right foot.  She presents the office today for an evaluation of this fifth toe but additionally requested an evaluation and treatment of her long thick nails. These are painful walking and wearing her shoes    Review of Systems  Constitutional: Positive for fatigue.  Eyes: Positive for visual disturbance.  Musculoskeletal: Positive for arthralgias, back pain and gait problem.  All other systems reviewed and are negative.      Objective:   Physical Exam GENERAL APPEARANCE: Alert, conversant. Appropriately groomed. No acute distress.  VASCULAR: Pedal pulses are  palpable at  [facility] and PT bilateral.  Capillary refill time is immediate to all digits,  Normal temperature gradient.  Digital hair growth is present bilateral  NEUROLOGIC: sensation is normal to 5.07 monofilament at 5/5 sites bilateral.  Light touch is intact bilateral, Muscle strength normal.  MUSCULOSKELETAL: acceptable muscle strength, tone and stability bilateral.  Intrinsic muscluature intact bilateral.  Rectus appearance of foot and digits noted bilateral. Clicking noted right ankle upon  ROM.   Swelling right ankle only.   NAILS  thick disfigured discolored nails with subungual debris noted both feet DERMATOLOGIC: skin color, texture, and turgor are within normal limits.  No preulcerative lesions or ulcers  are seen, no  interdigital maceration noted.  No open lesions present.  . No drainage noted. Patient has healing and desquamation noted of the fifth toe of the right foot.  No evidence of any pus drainage or infection noted at this time         Assessment & Plan:  Onychomycosis  B/L  S/P infected fifth toe right foot.  IE  X-rays taken to rule out any infection to the bone and there was no evidence of any cystic formation of the fifth toe of the right foot. There is a K wire in the first metatarsal as well as arthritic changes in the first MPJ.  Evaluation of her fifth toe reveals a fifth toe is healing with desquamation noted at the distal aspect of the toe.  Debridement of her mycotic nails were performed.  Patient to return to the office in 3 months for further evaluation and treatment   Gardiner Barefoot DPM

## 2017-05-13 ENCOUNTER — Ambulatory Visit: Payer: Medicare HMO | Admitting: Podiatry

## 2017-05-24 ENCOUNTER — Ambulatory Visit: Payer: Medicare HMO | Admitting: Podiatry

## 2017-09-02 ENCOUNTER — Ambulatory Visit (HOSPITAL_COMMUNITY)
Admission: EM | Admit: 2017-09-02 | Discharge: 2017-09-02 | Disposition: A | Discharge status: Home / Self Care | Payer: Medicare HMO

## 2017-09-02 ENCOUNTER — Encounter (HOSPITAL_COMMUNITY): Payer: Self-pay | Admitting: *Deleted

## 2017-09-02 DIAGNOSIS — K0889 Other specified disorders of teeth and supporting structures: Secondary | ICD-10-CM | POA: Diagnosis not present

## 2017-09-02 MED ORDER — AMOXICILLIN-POT CLAVULANATE 875-125 MG PO TABS: 1.0000 | ORAL_TABLET | Freq: Two times a day (BID) | ORAL | 0 refills | Status: AC

## 2017-09-02 MED ORDER — HYDROCODONE-ACETAMINOPHEN 5-325 MG PO TABS: 2.0000 | ORAL_TABLET | Freq: Four times a day (QID) | ORAL | 0 refills | Status: AC | PRN

## 2017-09-02 NOTE — ED Provider Notes (Signed)
[place]    CSN: [id number] Arrival date & time: 09/02/17  1014     History   Chief Complaint Chief Complaint  Patient presents with  . Dental Pain    HPI Kristy Baxter is a 67 y.o. female presenting with dental pain for 2 weeks. Pain in upper and lower jaws. Trying oral pain relief gel and Ibuprofen over the counter without relief. Coming in today because these measures have not helped her. Endorses mild chills. Denies facial swelling. Pain worsens with talking.  She has plans to get set up with a dentist for the teeth to be removed.    HPI  Past Medical History:  Diagnosis Date  . Hypertension     Patient Active Problem List   Diagnosis Date Noted  . CONSTIPATION 02/27/2009  . GI BLEED 02/27/2009  . BACK PAIN, CHRONIC, INTERMITTENT 01/21/2009  . GERD 08/05/2008  . PERIPHERAL EDEMA 08/05/2008  . HYPERLIPIDEMIA 07/24/2008  . ANEMIA-IRON DEFICIENCY 07/24/2008  . DEPRESSION 07/24/2008  . HYPERTENSION 07/24/2008  . DIVERTICULOSIS, COLON 07/24/2008  . RASH-NONVESICULAR 07/24/2008  . CHEST PAIN 07/24/2008  . ELECTROCARDIOGRAM, ABNORMAL 07/24/2008  . DIVERTICULITIS, HX OF 07/24/2008    History reviewed. No pertinent surgical history.  OB History    No data available       Home Medications    Prior to Admission medications   Medication Sig Start Date End Date Taking? Authorizing Provider  amLODipine (NORVASC) 5 MG tablet Take 5 mg by mouth daily. 04/14/16  Yes [provider]  aspirin EC 81 MG tablet Take 81 mg by mouth daily.   Yes [provider]  diclofenac (CATAFLAM) 50 MG tablet  12/25/16  Yes [provider]  pantoprazole (PROTONIX) 40 MG tablet  02/07/17  Yes [provider]  varenicline (CHANTIX PAK) 0.5 MG X 11 & 1 MG X 42 tablet Take by mouth 2 (two) times daily. Take one 0.5 mg tablet by mouth once daily for 3 days, then increase to one 0.5 mg tablet twice daily for 4 days, then increase to one 1 mg  tablet twice daily.   Yes [provider]  acetaminophen (TYLENOL) 500 MG tablet Take 1,000 mg by mouth every 6 (six) hours as needed (for pain.).    [provider]  amoxicillin-clavulanate (AUGMENTIN) 875-125 MG tablet Take 1 tablet by mouth every 12 (twelve) hours for 5 days. 09/02/17 09/07/17  Wieters, Hallie C, PA-C  HYDROcodone-acetaminophen (NORCO/VICODIN) 5-325 MG tablet Take 2 tablets by mouth every 6 (six) hours as needed for up to 2 days for severe pain. 09/02/17 09/04/17  Wieters, Elesa Hacker, PA-C    Family History History reviewed. No pertinent family history.  Social History Social History   Tobacco Use  . Smoking status: Current Every Day Smoker    Packs/day: 0.50    Types: Cigarettes  . Smokeless tobacco: Never Used  Substance Use Topics  . Alcohol use: No  . Drug use: No     Allergies   Sulfonamide derivatives   Review of Systems Review of Systems  Constitutional: Positive for chills. Negative for fever.  HENT: Positive for dental problem. Negative for sore throat and trouble swallowing.   Respiratory: Negative for shortness of breath.   Cardiovascular: Negative for chest pain.  Gastrointestinal: Negative for abdominal pain.  Neurological: Negative for facial asymmetry, speech difficulty, weakness and numbness.     Physical Exam Triage Vital Signs ED Triage Vitals  Enc Vitals Group     BP  09/02/17 1131 (!) 168/108     Pulse Rate 09/02/17 1131 88     Resp 09/02/17 1131 16     Temp 09/02/17 1131 98.9 F (37.2 C)     Temp Source 09/02/17 1131 Oral     SpO2 09/02/17 1131 96 %     Weight --      Height --      Head Circumference --      Peak Flow --      Pain Score 09/02/17 1134 10     Pain Loc --      Pain Edu? --      Excl. in [place]? --    No data found.  Updated Vital Signs BP (!) 168/108 (BP Location: Left Arm) Comment: Notified Nurse Preston Fleeting  Pulse 88   Temp 98.9 F (37.2 C) (Oral)   Resp 16   SpO2 96%   Visual  Acuity Right Eye Distance:   Left Eye Distance:   Bilateral Distance:    Right Eye Near:   Left Eye Near:    Bilateral Near:     Physical Exam  Constitutional: She appears well-developed and well-nourished. No distress.  HENT:  Head: Normocephalic and atraumatic.  No facial asymmetry, swelling or erythema.  Patient with poor dentition, lacking majority of teeth, still has 2 teeth on upper jaw #6 and 11. More teeth on lower jaw. All dentition in poor condition with black/brown discoloration near root.  Eyes: Conjunctivae are normal.  Neck: Neck supple.  Cardiovascular: Normal rate and regular rhythm.  No murmur heard. Pulmonary/Chest: Effort normal. No respiratory distress.  Abdominal: Soft. There is no tenderness.  Musculoskeletal: She exhibits no edema.  Neurological: She is alert.  Skin: Skin is warm and dry.  Psychiatric: She has a normal mood and affect.  Nursing note and vitals reviewed.    UC Treatments / Results  Labs (all labs ordered are listed, but only abnormal results are displayed) Labs Reviewed - No data to display  EKG  EKG Interpretation None       Radiology No results found.  Procedures Procedures (including critical care time)  Medications Ordered in UC Medications - No data to display   Initial Impression / Assessment and Plan / UC Course  I have reviewed the triage vital signs and the nursing notes.  Pertinent labs & imaging results that were available during my care of the patient were reviewed by me and considered in my medical decision making (see chart for details).     Patient given antibiotic and norco x 2 days for pain. Advised to only use for severe pain and do not drive while taking as it causes sedation. Anti-inflammatories for mild-moderate pain. Follow up with dentistry.   Final Clinical Impressions(s) / UC Diagnoses   Final diagnoses:  Pain, dental    ED Discharge Orders        Ordered    amoxicillin-clavulanate  (AUGMENTIN) 875-125 MG tablet  Every 12 hours     09/02/17 1156    HYDROcodone-acetaminophen (NORCO/VICODIN) 5-325 MG tablet  Every 6 hours PRN     09/02/17 1158       Controlled Substance Prescriptions [place] Controlled Substance Registry consulted? Yes, I have consulted the [place] Controlled Substances Registry for this patient, and feel the risk/benefit ratio today is favorable for proceeding with this prescription for a controlled substance.  Last prescription for oxycodone middle of November- for 5 days.    Janith Lima, [place] 09/02/17 1212

## 2017-09-02 NOTE — ED Triage Notes (Signed)
Patient reports bilateral dental pain to front of mouth, states pain x 2 weeks. Patient knows that she needs to have teeth removed.

## 2017-09-02 NOTE — Discharge Instructions (Addendum)

## 2017-09-18 DIAGNOSIS — I739 Peripheral vascular disease, unspecified: Secondary | ICD-10-CM | POA: Diagnosis present

## 2017-09-18 NOTE — H&P (Signed)
OFFICE VISIT NOTES COPIED TO EPIC FOR DOCUMENTATION  . History of Present Illness Kristy Leep MD; 09-04-17 12:01 PM) Patient words: Last OV 05/26/2017; f/u echo.  The patient is a 67 year old female who presents with peripheral vascular disease. 67 year old pleasant African American female with uncontrolled hypertension, hyperlipidemia referred for evaluation of peripheral vascular disease by Nolene Ebbs, MD. She is here to follow up her tests. .  Echocardiogram showed normal ejection fraction 72%, grade 2 diastolic dysfunction with elevated left and right atrial pressure. Pulmonary hypertension with moderate tricuspid regurgitation. PASP 40-45 mmHg. Lower extremity ultrasound showed normal perfusion of right lower extremity with ABI of 1.02, and moderately decreased perfusion of left lower extremity with ABI 0.57. Positive waveform and the entire exodus of significant inflow iliac stenosis..  Patient continues to have significant left claudication. 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 She frequently has to stop due to claudication. She continues to smoke 6 cigarettes per day.   Problem List/Past Medical Kristy Baxter; 04-Sep-2017 10:16 AM) Acid reflux (K21.9)  Arthritis (M19.90)  Laboratory examination (Z01.89)  6/172017: BUN 20/creatinine 1.25 H/H 12/38 MCV normal. Platelet count 335. Cholesterol 229, triglycerides 94, and HDL 47, LDL 163. Normal TSH. Benign essential hypertension (I10)  Echocardiogram 06/28/2017: Left ventricle cavity is normal in size. Mild concentric hypertrophy of the left ventricle. Normal global wall motion. Elevated LAP. Grade II diastolic dysfunction Calculated EF 65%. Right atrial cavity is slightly dilated. Moderate tricuspid regurgitation. Pulmonary artery systolic pressure is estimated at 40-45 mm Hg. EKG 05/26/2017: Sinus rhythm 92 bpm. Normal axis. Normal conduction. Borderline right atrial dilatation. Frequent  PAC's Hyperlipidemia, mixed (E78.2)  PAD (peripheral artery disease) (I73.9)  Lower extremity arterial duplex 06/28/2017: No hemodynamically significant stenoses are identified in the right lower extremity arterial system. Monophasic waveform in the entire left leg suggests signficant inflow (Iliac stenosis) disease. This exam reveals normal perfusion of the right lower extremity (ABI). with RABI 1.02 and moderately decreased perfusion of the left lower extremity, noted at the dorsalis pedis and post tibial artery level (ABI) with LABI 0.57.  Allergies Kristy Baxter; 2017-09-04 10:16 AM) Bactrim *ANTI-INFECTIVE AGENTS - MISC.*  glossy eyes  Family History Kristy Baxter; 09/04/2017 10:16 AM) Mother  Deceased. at age 50 from liver failure, heart issues but not heart attack or stroke, htn Father  Deceased. at age 65 from heart attack, no strokes Sister 3  1 deceased-copd, asthma, no heart attacks or strokes, no known cardiovascular conditions Brother 2  younger-htn, heart attack, hiv, no strokes  Social History Kristy Baxter; 09/04/17 10:16 AM) Current tobacco use  Current every day smoker. 3-4 cigarettes daily, trying nicotine patch 20 PY smoking history Non Drinker/No Alcohol Use  has not drank in 10 yrs, no abuse Marital status  Divorced. Living Situation  Lives with relatives. sister and son Number of Children  3. 1 deceased  Past Surgical History Kristy Baxter; 09/04/2017 10:16 AM) Hysterectomy; Vaginal [1990]: Hernia Repair [2008]: Cyst Removal [1980]: Right. wrist  Medication History Kristy Baxter; Sep 04, 2017 10:20 AM) Rosuvastatin Calcium ('20MG'$  Tablet, 1 (one) Tablet Oral dail, Taken starting 05/26/2017) Active. Calcium 600 ('600MG'$  Tablet, 1 Oral daily) Active. Pantoprazole Sodium ('40MG'$  Tablet DR, 1 Oral two times daily) Active. AmLODIPine Besylate ('5MG'$  Tablet, 2 Oral daily) Active. Diclofenac Potassium ('50MG'$  Tablet, 1 Oral two times daily)  Active. Ondansetron HCl ('4MG'$  Tablet, 1 Oral every 8 hrs prn) Active. Oxycodone-Acetaminophen (5-'325MG'$  Tablet, Oral prn) Active. Medications Reconciled (verbally)  Diagnostic Studies History Kristy Baxter; 04-Sep-2017 10:14 AM)  Lower Extremity Dopplers [06/28/2017]: No hemodynamically significant stenoses are identified in the right lower extremity arterial system. Monophasic waveform in the entire left leg suggests signficant inflow (Iliac stenosis) disease. This exam reveals normal perfusion of the right lower extremity (ABI). with RABI 1.02 and moderately decreased perfusion of the left lower extremity, noted at the dorsalis pedis and post tibial artery level (ABI) with LABI 0.57. Echocardiogram [06/28/2017]: Left ventricle cavity is normal in size. Mild concentric hypertrophy of the left ventricle. Normal global wall motion. Elevated LAP. Grade II diastolic dysfunction Calculated EF 65%. Right atrial cavity is slightly dilated. Moderate tricuspid regurgitation. Pulmonary artery systolic pressure is estimated at 40-45 mm Hg.  Other Problems Kristy Baxter; 08/10/2017 10:16 AM) Peripheral artery disease  tobacco abuse     Review of Systems Kristy Leep, MD; 08/10/2017 12:5 PM) Cardiovascular Present- Claudications and Edema. Not Present- Chest Pain, Difficulty Breathing Lying Down, Difficulty Breathing On Exertion, Fainting and Paroxysmal Nocturnal Dyspnea.  Vitals Kristy Baxter; 08/10/2017 10:21 AM) 08/10/2017 10:17 AM Weight: 182.44 lb Height: 60in Body Surface Area: 1.8 m Body Mass Index: 35.63 kg/m  Pulse: 89 (Regular)  P.OX: 96% (Room air) BP: 122/78 (Sitting, Left Arm, Standard)       Physical Exam Joya Gaskins Patwardhan MD; 08/10/2017 12:01 PM) General Mental Status-Alert. General Appearance-Cooperative and Appears stated age. Build & Nutrition-Moderately built.  Head and Neck Thyroid Gland Characteristics - normal size and consistency  and no palpable nodules.  Chest and Lung Exam Chest and lung exam reveals -quiet, even and easy respiratory effort with no use of accessory muscles, non-tender and on auscultation, normal breath sounds, no adventitious sounds.  Cardiovascular Cardiovascular examination reveals -carotid auscultation reveals no bruits and abdominal aorta auscultation reveals no bruits and no prominent pulsation. Auscultation Rhythm - Regular. Heart Sounds - S1 WNL and S2 WNL. Murmurs & Other Heart Sounds: Murmur - Location - Apex. Timing - Mid-diastolic. Grade - II/VI.  Abdomen Palpation/Percussion Normal exam - Non Tender and No hepatosplenomegaly.  Peripheral Vascular Lower Extremity Inspection - Right - Note: No gangrene. 5th toe soft, prior surgery. Inspection - Bilateral - Delayed capillary refill, Loss of hair and Thick rigid nails, No Digital ulcers. Palpation - Femoral pulse - Left - Feeble. Right - 2+. Dorsalis pedis pulse - Left - Feeble. Right - 2+. Posterior tibial pulse - Left - Feeble. Right - 1+. Carotid arteries - Bilateral-No Carotid bruit. Note: No bruit   Neurologic Neurologic evaluation reveals -alert and oriented x 3 with no impairment of recent or remote memory. Motor-Grossly intact without any focal deficits.  Musculoskeletal Global Assessment Left Lower Extremity - no deformities, masses or tenderness, no known fractures. Right Lower Extremity - no deformities, masses or tenderness, no known fractures.   Results Joya Gaskins Patwardhan MD; 08/10/2017 12:04 PM) Procedures  Name Value Date Echocardiography, transthoracic, real-time with image documentation (2D), includes M-mode recording, when performed, complete, with spectral Doppler echocardiography, and with color flow Doppler echocardiography (18841) Comments: Echocardiogram 06/28/2017: Left ventricle cavity is normal in size. Mild concentric hypertrophy of the left ventricle. Normal global wall motion. Elevated  LAP. Grade II diastolic dysfunction Calculated EF 65%. Right atrial cavity is slightly dilated. Moderate tricuspid regurgitation. Pulmonary artery systolic pressure is estimated at 40-45 mm Hg.  Performed: 06/28/2017 1:55 PM Complete duplex ultrasound of arteries of lower extremity (66063) Comments: Lower extremity arterial duplex 06/28/2017: No hemodynamically significant stenoses are identified in the right lower extremity arterial system. Monophasic waveform in the entire left leg suggests signficant inflow (Iliac stenosis)  disease. This exam reveals normal perfusion of the right lower extremity (ABI). with RABI 1.02 and moderately decreased perfusion of the left lower extremity, noted at the dorsalis pedis and post tibial artery level (ABI) with LABI 0.57.  Performed: 06/28/2017 1:55 PM    Assessment & Plan Joya Gaskins Patwardhan MD; 08/10/2017 12:04 PM) Benign essential hypertension (I10) Story: Echocardiogram 06/28/2017: Left ventricle cavity is normal in size. Mild concentric hypertrophy of the left ventricle. Normal global wall motion. Elevated LAP. Grade II diastolic dysfunction Calculated EF 65%. Right atrial cavity is slightly dilated. Moderate tricuspid regurgitation. Pulmonary artery systolic pressure is estimated at 40-45 mm Hg.  EKG 05/26/2017: Sinus rhythm 92 bpm. Normal axis. Normal conduction. Borderline right atrial dilatation. Frequent PAC's Hyperlipidemia, mixed (E78.2) Peripheral artery disease Current Plans Started Aspirin 81 '81MG'$ , 1 (one) Tablet once daily, #60, 60 days starting 08/10/2017, No Refill. Started Cilostazol '50MG'$ , 1 (one) Tablet twice daily, #120, 60 days starting 08/10/2017, Ref. x1. Tobacco abuse (Z72.0) Current Plans Started Chantix Starting Month Pak 0.5 MG X 11 &1 MG X 42, 1 (one) Tablet once daily, 1 Packet, 30 days starting 08/10/2017, Ref. x3. Education about smoking cessation for less than 10 minutes by physician [phone]) PAD (peripheral artery  disease) (I73.9) Story: Lower extremity arterial duplex 06/28/2017: No hemodynamically significant stenoses are identified in the right lower extremity arterial system. Monophasic waveform in the entire left leg suggests signficant inflow (Iliac stenosis) disease. This exam reveals normal perfusion of the right lower extremity (ABI). with RABI 1.02 and moderately decreased perfusion of the left lower extremity, noted at the dorsalis pedis and post tibial artery level (ABI) with LABI 0.57.   Laboratory examination (Q65.78) Story: Labs: 09/15/2017: Glucose 129.  BUN/crit 27.33.  EGFR 47.  Sodium 142, potassium 4.6 H/H 11.3/35.6.  MCV 92.  Platelets 341. INR 1.0 6/172017: BUN 20/creatinine 1.25 H/H 12/38 MCV normal. Platelet count 335. Cholesterol 229, triglycerides 94, and HDL 47, LDL 163. Normal TSH. Future Plans 09/12/2017: PT (PROTHROMBIN TIME) (46962) - one time Pulmonary hypertension (I27.20)  Note:Recommendations: 67 year old pleasant African American female with uncontrolled hypertension, hyperlipidemia,  moderate asymptomatic pulmonary hypertension, peripheral artery disease with severe lifestyle limiting claudication symptoms. No asymmetry Doppler ultrasound suggests severe inflow disease on left with reduced ABI 0.57.  Recommend starting on aspirin 81 mg, and cilostazol 50 mg twice daily. Have discussed the risks and benefits of peripheral angiogram and angioplasty with the patient. She would like to proceed with the same. In the meantime, we will assess any improvement in symptoms on cilostazol. Blood pressure is better controlled today. She is on rosuvastatin 20 mg. I have encouraged her to continue walking.  Moderate pulmonary hypertension with tricuspid regurgitation on echocardiogram. She does not have overt symptoms of dyspnea on exertion at this time. 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 We'll focus on hypertension control at this time. In future, if  she develops more symptoms, she may need a right heart catheterization.  Tobacco cessation counseling (CPT 99406):  Tobacco abuse with suspected PAD  - Currently smoking 1/4 packs/day  - Patient was informed of the dangers of tobacco abuse including stroke, cancer, and MI, as well as benefits of tobacco cessation. - Patient is willing to quit at this time. - Approximately 10 mins were spent counseling patient cessation techniques. We discussed various methods to help quit smoking, including deciding on a date to quit, joining a support group, pharmacological agents- nicotine gum/patch/lozenges, chantix. She would like to try chantix. - I will reassess  his progress at his next follow-up visit   WIll see her back in 4-6 weeks  Cc Nolene Ebbs, MD  Signed electronically by Kristy Leep, MD (08/10/2017 12:05 PM)

## 2017-09-20 ENCOUNTER — Encounter (HOSPITAL_COMMUNITY): Payer: Self-pay | Admitting: Cardiology

## 2017-09-20 ENCOUNTER — Ambulatory Visit (HOSPITAL_COMMUNITY)
Admission: RE | Disposition: A | Discharge status: Home / Self Care | Payer: Self-pay | Source: Ambulatory Visit | Attending: Cardiology

## 2017-09-20 ENCOUNTER — Ambulatory Visit (HOSPITAL_COMMUNITY)
Admission: RE | Admit: 2017-09-20 | Discharge: 2017-09-20 | Disposition: A | Discharge status: Home / Self Care | Payer: Medicare HMO | Source: Ambulatory Visit | Attending: Cardiology | Admitting: Cardiology

## 2017-09-20 DIAGNOSIS — I071 Rheumatic tricuspid insufficiency: Secondary | ICD-10-CM | POA: Diagnosis not present

## 2017-09-20 DIAGNOSIS — I272 Pulmonary hypertension, unspecified: Secondary | ICD-10-CM | POA: Diagnosis not present

## 2017-09-20 DIAGNOSIS — Z7982 Long term (current) use of aspirin: Secondary | ICD-10-CM | POA: Insufficient documentation

## 2017-09-20 DIAGNOSIS — Z79899 Other long term (current) drug therapy: Secondary | ICD-10-CM | POA: Insufficient documentation

## 2017-09-20 DIAGNOSIS — E782 Mixed hyperlipidemia: Secondary | ICD-10-CM | POA: Diagnosis not present

## 2017-09-20 DIAGNOSIS — I739 Peripheral vascular disease, unspecified: Secondary | ICD-10-CM | POA: Insufficient documentation

## 2017-09-20 DIAGNOSIS — F17210 Nicotine dependence, cigarettes, uncomplicated: Secondary | ICD-10-CM | POA: Diagnosis not present

## 2017-09-20 DIAGNOSIS — K219 Gastro-esophageal reflux disease without esophagitis: Secondary | ICD-10-CM | POA: Insufficient documentation

## 2017-09-20 DIAGNOSIS — I129 Hypertensive chronic kidney disease with stage 1 through stage 4 chronic kidney disease, or unspecified chronic kidney disease: Secondary | ICD-10-CM | POA: Diagnosis not present

## 2017-09-20 DIAGNOSIS — I708 Atherosclerosis of other arteries: Secondary | ICD-10-CM | POA: Insufficient documentation

## 2017-09-20 DIAGNOSIS — I70213 Atherosclerosis of native arteries of extremities with intermittent claudication, bilateral legs: Secondary | ICD-10-CM | POA: Diagnosis not present

## 2017-09-20 DIAGNOSIS — N183 Chronic kidney disease, stage 3 (moderate): Secondary | ICD-10-CM | POA: Diagnosis not present

## 2017-09-20 HISTORY — PX: LOWER EXTREMITY ANGIOGRAPHY: CATH118251

## 2017-09-20 SURGERY — LOWER EXTREMITY ANGIOGRAPHY
Anesthesia: LOCAL

## 2017-09-20 MED ORDER — IODIXANOL 320 MG/ML IV SOLN
INTRAVENOUS | Status: DC | PRN
  Administered 2017-09-20: 165 mL via INTRA_ARTERIAL

## 2017-09-20 MED ORDER — SODIUM CHLORIDE 0.9% FLUSH: 3.0000 mL | Freq: Two times a day (BID) | INTRAVENOUS | Status: DC

## 2017-09-20 MED ORDER — SODIUM CHLORIDE 0.9 % IV SOLN: 250.0000 mL | INTRAVENOUS | Status: DC | PRN

## 2017-09-20 MED ORDER — SODIUM CHLORIDE 0.9 % IV BOLUS (SEPSIS)
500.0000 mL | Freq: Once | INTRAVENOUS | Status: AC
  Administered 2017-09-20: 500 mL via INTRAVENOUS

## 2017-09-20 MED ORDER — FENTANYL CITRATE (PF) 100 MCG/2ML IJ SOLN
INTRAMUSCULAR | Status: DC | PRN
  Administered 2017-09-20 (×2): 50 ug via INTRAVENOUS

## 2017-09-20 MED ORDER — LIDOCAINE HCL (PF) 1 % IJ SOLN
INTRAMUSCULAR | Status: DC | PRN
  Administered 2017-09-20: 20 mL via INTRADERMAL

## 2017-09-20 MED ORDER — SODIUM CHLORIDE 0.9 % IV SOLN: INTRAVENOUS | Status: DC

## 2017-09-20 MED ORDER — LIDOCAINE HCL 1 % IJ SOLN
INTRAMUSCULAR | Status: AC
  Filled 2017-09-20: qty 20

## 2017-09-20 MED ORDER — MIDAZOLAM HCL 2 MG/2ML IJ SOLN
INTRAMUSCULAR | Status: AC
  Filled 2017-09-20: qty 2

## 2017-09-20 MED ORDER — FENTANYL CITRATE (PF) 100 MCG/2ML IJ SOLN
INTRAMUSCULAR | Status: AC
  Filled 2017-09-20: qty 2

## 2017-09-20 MED ORDER — HEPARIN (PORCINE) IN NACL 2-0.9 UNIT/ML-% IJ SOLN
INTRAMUSCULAR | Status: AC | PRN
  Administered 2017-09-20: 1000 mL via INTRA_ARTERIAL

## 2017-09-20 MED ORDER — HYDRALAZINE HCL 20 MG/ML IJ SOLN: 5.0000 mg | INTRAMUSCULAR | Status: DC | PRN

## 2017-09-20 MED ORDER — HEPARIN (PORCINE) IN NACL 2-0.9 UNIT/ML-% IJ SOLN
INTRAMUSCULAR | Status: AC
  Filled 2017-09-20: qty 1000

## 2017-09-20 MED ORDER — SODIUM CHLORIDE 0.9% FLUSH: 3.0000 mL | INTRAVENOUS | Status: DC | PRN

## 2017-09-20 MED ORDER — SODIUM CHLORIDE 0.9 % WEIGHT BASED INFUSION: 1.0000 mL/kg/h | INTRAVENOUS | Status: DC

## 2017-09-20 MED ORDER — MIDAZOLAM HCL 2 MG/2ML IJ SOLN
INTRAMUSCULAR | Status: DC | PRN
  Administered 2017-09-20: 2 mg via INTRAVENOUS

## 2017-09-20 SURGICAL SUPPLY — 16 items
CATH ANGIO 5F PIGTAIL 65CM (CATHETERS) ×2 IMPLANT
CATH STRAIGHT 5FR 65CM (CATHETERS) ×2 IMPLANT
COVER PRB 48X5XTLSCP FOLD TPE (BAG) ×1 IMPLANT
COVER PROBE 5X48 (BAG) ×1
DEVICE CLOSURE MYNXGRIP 5F (Vascular Products) ×2 IMPLANT
GUIDEWIRE ANGLED .035X150CM (WIRE) ×2 IMPLANT
KIT MICROINTRODUCER STIFF 5F (SHEATH) ×2 IMPLANT
KIT PV (KITS) ×2 IMPLANT
SHEATH PINNACLE 5F 10CM (SHEATH) ×2 IMPLANT
STOPCOCK MORSE 400PSI 3WAY (MISCELLANEOUS) ×2 IMPLANT
SYRINGE MEDRAD AVANTA MACH 7 (SYRINGE) ×2 IMPLANT
TRANSDUCER W/STOPCOCK (MISCELLANEOUS) ×2 IMPLANT
TRAY PV CATH (CUSTOM PROCEDURE TRAY) ×2 IMPLANT
WIRE HITORQ VERSACORE ST 145CM (WIRE) ×2 IMPLANT
WIRE MICROINTRODUCER 60CM (WIRE) ×2 IMPLANT
WIRE TORQFLEX AUST .018X40CM (WIRE) ×2 IMPLANT

## 2017-09-20 NOTE — Interval H&P Note (Signed)
History and Physical Interval Note:  09/20/2017 7:38 AM  Kristy Baxter  has presented today for surgery, with the diagnosis of pad  The various methods of treatment have been discussed with the patient and family. After consideration of risks, benefits and other options for treatment, the patient has consented to  Procedure(s): LOWER EXTREMITY ANGIOGRAPHY (N/A) and possible angioplasty as a surgical intervention .  The patient's history has been reviewed, patient examined, no change in status, stable for surgery.  I have reviewed the patient's chart and labs.  Questions were answered to the patient's satisfaction.     [name]

## 2017-09-20 NOTE — Discharge Instructions (Signed)
Femoral Site Care °Refer to this sheet in the next few weeks. These instructions provide you with information about caring for yourself after your procedure. Your health care provider may also give you more specific instructions. Your treatment has been planned according to current medical practices, but problems sometimes occur. Call your health care provider if you have any problems or questions after your procedure. °What can I expect after the procedure? °After your procedure, it is typical to have the following: °· Bruising at the site that usually fades within 1-2 weeks. °· Blood collecting in the tissue (hematoma) that may be painful to the touch. It should usually decrease in size and tenderness within 1-2 weeks. ° °Follow these instructions at home: °· Take medicines only as directed by your health care provider. °· You may shower 24-48 hours after the procedure or as directed by your health care provider. Remove the bandage (dressing) and gently wash the site with plain soap and water. Pat the area dry with a clean towel. Do not rub the site, because this may cause bleeding. °· Do not take baths, swim, or use a hot tub until your health care provider approves. °· Check your insertion site every day for redness, swelling, or drainage. °· Do not apply powder or lotion to the site. °· Limit use of stairs to twice a day for the first 2-3 days or as directed by your health care provider. °· Do not squat for the first 2-3 days or as directed by your health care provider. °· Do not lift over 10 lb (4.5 kg) for 5 days after your procedure or as directed by your health care provider. °· Ask your health care provider when it is okay to: °? Return to work or school. °? Resume usual physical activities or sports. °? Resume sexual activity. °· Do not drive home if you are discharged the same day as the procedure. Have someone else drive you. °· You may drive 24 hours after the procedure unless otherwise instructed by  your health care provider. °· Do not operate machinery or power tools for 24 hours after the procedure or as directed by your health care provider. °· If your procedure was done as an outpatient procedure, which means that you went home the same day as your procedure, a responsible adult should be with you for the first 24 hours after you arrive home. °· Keep all follow-up visits as directed by your health care provider. This is important. °Contact a health care provider if: °· You have a fever. °· You have chills. °· You have increased bleeding from the site. Hold pressure on the site. °Get help right away if: °· You have unusual pain at the site. °· You have redness, warmth, or swelling at the site. °· You have drainage (other than a small amount of blood on the dressing) from the site. °· The site is bleeding, and the bleeding does not stop after 30 minutes of holding steady pressure on the site. °· Your leg or foot becomes pale, cool, tingly, or numb. °This information is not intended to replace advice given to you by your health care provider. Make sure you discuss any questions you have with your health care provider. °Document Released: 04/19/2014 Document Revised: 01/22/2016 Document Reviewed: 03/05/2014 °Elsevier Interactive Patient Education © 2018 [employer] °Moderate Conscious Sedation, Adult, Care After °These instructions provide you with information about caring for yourself after your procedure. Your health care provider may also give you more   specific instructions. Your treatment has been planned according to current medical practices, but problems sometimes occur. Call your health care provider if you have any problems or questions after your procedure. °What can I expect after the procedure? °After your procedure, it is common: °· To feel sleepy for several hours. °· To feel clumsy and have poor balance for several hours. °· To have poor judgment for several hours. °· To vomit if you eat too  soon. ° °Follow these instructions at home: °For at least 24 hours after the procedure: ° °· Do not: °? Participate in activities where you could fall or become injured. °? Drive. °? Use heavy machinery. °? Drink alcohol. °? Take sleeping pills or medicines that cause drowsiness. °? Make important decisions or sign legal documents. °? Take care of children on your own. °· Rest. °Eating and drinking °· Follow the diet recommended by your health care provider. °· If you vomit: °? Drink water, juice, or soup when you can drink without vomiting. °? Make sure you have little or no nausea before eating solid foods. °General instructions °· Have a responsible adult stay with you until you are awake and alert. °· Take over-the-counter and prescription medicines only as told by your health care provider. °· If you smoke, do not smoke without supervision. °· Keep all follow-up visits as told by your health care provider. This is important. °Contact a health care provider if: °· You keep feeling nauseous or you keep vomiting. °· You feel light-headed. °· You develop a rash. °· You have a fever. °Get help right away if: °· You have trouble breathing. °This information is not intended to replace advice given to you by your health care provider. Make sure you discuss any questions you have with your health care provider. °Document Released: 06/06/2013 Document Revised: 01/19/2016 Document Reviewed: 12/06/2015 °Elsevier Interactive Patient Education © 2018 [employer] ° °

## 2017-09-20 NOTE — Consult Note (Signed)
Hospital Consult    Reason for Consult:  claudication Referring Physician:  Dr. Einar Gip MRN #:  [id number]  History of Present Illness: This is a 67 y.o. female with history of hypertension hyperlipidemia and previously was a smoker but quit in December.  She is a patient of Dr. Einar Gip with pulmonary hypertension as well as mild tricuspid regurgitation.  She does have right lower extremity swelling and left lower extremity pain when she walks.  She does walk significant amount for her job.  She denies rest pain tissue loss.  She has never had lower extremity intervention before.  She underwent angiogram the day of this consult.  She does take aspirin and a statin drug.  Past Medical History:  Diagnosis Date  . Hypertension     Surgical history: hernia repair.  Allergies  Allergen Reactions  . Sulfonamide Derivatives Other (See Comments)    Swollen eyes     Prior to Admission medications   Medication Sig Start Date End Date Taking? Authorizing Provider  acetaminophen (TYLENOL) 500 MG tablet Take 1,000-1,500 mg by mouth every 6 (six) hours as needed for mild pain or headache.    Yes [provider]  amLODipine (NORVASC) 10 MG tablet Take 10 mg by mouth daily.  04/14/16  Yes [provider]  aspirin EC 81 MG tablet Take 81 mg by mouth daily.   Yes [provider]  Calcium Carb-Cholecalciferol (CALCIUM-VITAMIN D3) 600-400 MG-UNIT TABS Take 1 tablet by mouth daily.   Yes [provider]  cilostazol (PLETAL) 50 MG tablet Take 50 mg by mouth 2 (two) times daily. 08/10/17  Yes [provider]  diclofenac (CATAFLAM) 50 MG tablet Take 50 mg by mouth daily.  12/25/16  Yes [provider]  pantoprazole (PROTONIX) 40 MG tablet Take 80 mg by mouth daily.  02/07/17  Yes [provider]  rosuvastatin (CRESTOR) 20 MG tablet Take 20 mg by mouth daily.   Yes [provider]    Social History   Socioeconomic History  . Marital  status: Divorced    Spouse name: Not on file  . Number of children: Not on file  . Years of education: Not on file  . Highest education level: Not on file  Social Needs  . Financial resource strain: Not on file  . Food insecurity - worry: Not on file  . Food insecurity - inability: Not on file  . Transportation needs - medical: Not on file  . Transportation needs - non-medical: Not on file  Occupational History  . Not on file  Tobacco Use  . Smoking status: Current Every Day Smoker    Packs/day: 0.50    Types: Cigarettes  . Smokeless tobacco: Never Used  Substance and Sexual Activity  . Alcohol use: No  . Drug use: No  . Sexual activity: Not on file  Other Topics Concern  . Not on file  Social History Narrative  . Not on file     No family history on file.  REVIEW OF SYSTEMS (negative unless checked):   Cardiac:  []  Chest pain or chest pressure? []  Shortness of breath upon activity? []  Shortness of breath when lying flat? []  Irregular heart rhythm?  Vascular:  [x]  Pain in calf, thigh, or hip brought on by walking? []  Pain in feet at night that wakes you up from your sleep? []  Blood clot in your veins? [x]  Leg swelling?  Pulmonary:  []  Oxygen at home? []  Productive cough? []  Wheezing?  Neurologic:  []   Sudden weakness in arms or legs? []  Sudden numbness in arms or legs? []  Sudden onset of difficult speaking or slurred speech? []  Temporary loss of vision in one eye? []  Problems with dizziness?  Gastrointestinal:  []  Blood in stool? []  Vomited blood?  Genitourinary:  []  Burning when urinating? []  Blood in urine?  Psychiatric:  []  Major depression  Hematologic:  []  Bleeding problems? []  Problems with blood clotting?  Dermatologic:  []  Rashes or ulcers?  Constitutional:  []  Fever or chills?  Ear/Nose/Throat:  []  Change in hearing? []  Nose bleeds? []  Sore throat?  Musculoskeletal:  []  Back pain? []  Joint pain? []  Muscle  pain?   Physical Examination  Vitals:   09/20/17 0848 09/20/17 0853  BP:    Pulse: (!) 0 (!) 134  Resp: (!) 0 (!) 0  Temp:    SpO2: (!) 0% (!) 0%   Body mass index is 35.14 kg/m.  General:  WDWN in NAD HENT: WNL, normocephalic Pulmonary: normal non-labored breathing Cardiac: rrr, palpable right femoral and popliteal pulses No palpable left lower extremity pulses Abdomen: soft, NT/ND, no masses; well healed low midline incision Extremities: no wounds Musculoskeletal: no muscle wasting or atrophy  Neurologic: A&O X 3; SENSATION: normal; MOTOR FUNCTION:  moving all extremities equally. Speech is fluent/normal Psychiatric: appropriate mood and affect  CBC    Component Value Date/Time   WBC 11.4 (H) 05/16/2016 2336   RBC 4.51 05/16/2016 2336   HGB 13.3 05/16/2016 2336   HCT 40.9 05/16/2016 2336   PLT 293 05/16/2016 2336   MCV 90.7 05/16/2016 2336   MCH 29.5 05/16/2016 2336   MCHC 32.5 05/16/2016 2336   RDW 14.9 05/16/2016 2336   LYMPHSABS 1.6 05/16/2016 2336   MONOABS 0.8 05/16/2016 2336   EOSABS 0.0 05/16/2016 2336   BASOSABS 0.0 05/16/2016 2336    BMET    Component Value Date/Time   NA 138 05/16/2016 2336   K 3.8 05/16/2016 2336   CL 102 05/16/2016 2336   CO2 29 05/16/2016 2336   GLUCOSE 106 (H) 05/16/2016 2336   BUN 23 (H) 05/16/2016 2336   CREATININE 1.78 (H) 05/16/2016 2336   CALCIUM 8.9 05/16/2016 2336   GFRNONAA 28 (L) 05/16/2016 2336   GFRAA 33 (L) 05/16/2016 2336    COAGS: Lab Results  Component Value Date   INR 1.0 08/01/2008   INR 0.9 08/01/2008     Non-Invasive Vascular Imaging:      ASSESSMENT/PLAN: This is a 67 y.o. female underwent angiogram today for significant life limiting claudication with the moderately decreased ABI on the left.  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  She would be a candidate for either femorofemoral bypass or a left common iliac to common femoral  bypass that could be performed via a retroperitoneal incision.  She is going to have a stress test with Dr. Einar Gip and I will see her in the office in a few weeks to discuss surgical intervention.  I have congratulated her on smoking cessation.  She will continue aspirin and statin we will get her scheduled soon.   Brandon C. Donzetta Matters, MD [facility] Office: [phone] Pager: [phone]

## 2017-09-21 MED FILL — Lidocaine HCl Local Inj 1%: INTRAMUSCULAR | Qty: 20 | Status: AC

## 2017-09-30 ENCOUNTER — Other Ambulatory Visit: Payer: Self-pay | Admitting: *Deleted

## 2017-10-10 NOTE — Pre-Procedure Instructions (Signed)
MIDORI DADO  10/10/2017      [facility] [address], [place] [address] Phone: [phone] Fax: [phone]    Your procedure is scheduled on Thursday February 14.  Report to [facility] Admitting at 5:30 A.M.  Call this number if you have problems the morning of surgery:  [phone]   Remember:  Do not eat food or drink liquids after midnight.  Take these medicines the morning of surgery with A SIP OF WATER:   Amlodipine (norvasc) Metoprolol (Toprol-XL) Pantoprazole (protonix) Acetaminophen (tylenol) if needed  7 days prior to surgery STOP taking any Aleve, Naproxen, Ibuprofen, Motrin, Advil, Goody's, BC's, all herbal medications, fish oil, and all vitamins  **Follow your surgeon's instructions on stopping Aspirin. If no instructions were given, please call surgeon's office**   Do not wear jewelry, make-up or nail polish.  Do not wear lotions, powders, or perfumes, or deodorant.  Do not shave 48 hours prior to surgery.  Men may shave face and neck.  Do not bring valuables to the hospital.  [facility] is not responsible for any belongings or valuables.  Contacts, dentures or bridgework may not be worn into surgery.  Leave your suitcase in the car.  After surgery it may be brought to your room.  For patients admitted to the hospital, discharge time will be determined by your treatment team.  Patients discharged the day of surgery will not be allowed to drive home.    Special instructions:    [facility]- Preparing For Surgery  Before surgery, you can play an important role. Because skin is not sterile, your skin needs to be as free of germs as possible. You can reduce the number of germs on your skin by washing with CHG (chlorahexidine gluconate) Soap before surgery.  CHG is an antiseptic cleaner which kills germs and bonds with the skin to continue killing germs even  after washing.  Please do not use if you have an allergy to CHG or antibacterial soaps. If your skin becomes reddened/irritated stop using the CHG.  Do not shave (including legs and underarms) for at least 48 hours prior to first CHG shower. It is OK to shave your face.  Please follow these instructions carefully.   1. Shower the NIGHT BEFORE SURGERY and the MORNING OF SURGERY with CHG.   2. If you chose to wash your hair, wash your hair first as usual with your normal shampoo.  3. After you shampoo, rinse your hair and body thoroughly to remove the shampoo.  4. Use CHG as you would any other liquid soap. You can apply CHG directly to the skin and wash gently with a scrungie or a clean washcloth.   5. Apply the CHG Soap to your body ONLY FROM THE NECK DOWN.  Do not use on open wounds or open sores. Avoid contact with your eyes, ears, mouth and genitals (private parts). Wash Face and genitals (private parts)  with your normal soap.  6. Wash thoroughly, paying special attention to the area where your surgery will be performed.  7. Thoroughly rinse your body with warm water from the neck down.  8. DO NOT shower/wash with your normal soap after using and rinsing off the CHG Soap.  9. Pat yourself dry with a CLEAN TOWEL.  10. Wear CLEAN PAJAMAS to bed the night before surgery, wear comfortable clothes the morning of surgery  11. Place CLEAN  SHEETS on your bed the night of your first shower and DO NOT SLEEP WITH PETS.    Day of Surgery: Do not apply any deodorants/lotions. Please wear clean clothes to the hospital/surgery center.      Please read over the following fact sheets that you were given. Coughing and Deep Breathing, MRSA Information and Surgical Site Infection Prevention

## 2017-10-11 ENCOUNTER — Encounter (HOSPITAL_COMMUNITY)
Admission: RE | Admit: 2017-10-11 | Discharge: 2017-10-11 | Disposition: A | Discharge status: Home / Self Care | Payer: Medicare HMO | Source: Ambulatory Visit | Attending: Vascular Surgery | Admitting: Vascular Surgery

## 2017-10-11 ENCOUNTER — Encounter (HOSPITAL_COMMUNITY): Payer: Self-pay | Admitting: Urology

## 2017-10-11 ENCOUNTER — Other Ambulatory Visit: Payer: Self-pay

## 2017-10-11 HISTORY — DX: Gastro-esophageal reflux disease without esophagitis: K21.9

## 2017-10-11 HISTORY — DX: Peripheral vascular disease, unspecified: I73.9

## 2017-10-11 LAB — URINALYSIS, ROUTINE W REFLEX MICROSCOPIC
BILIRUBIN URINE: NEGATIVE
Glucose, UA: NEGATIVE mg/dL
HGB URINE DIPSTICK: NEGATIVE
KETONES UR: NEGATIVE mg/dL
Leukocytes, UA: NEGATIVE
NITRITE: NEGATIVE
PH: 5 (ref 5.0–8.0)
Protein, ur: NEGATIVE mg/dL
Specific Gravity, Urine: 1.015 (ref 1.005–1.030)

## 2017-10-11 LAB — BLOOD GAS, ARTERIAL
ACID-BASE EXCESS: 2.2 mmol/L — AB (ref 0.0–2.0)
Bicarbonate: 26.2 mmol/L (ref 20.0–28.0)
DRAWN BY: 470591
FIO2: 21
O2 SAT: 97.7 %
PATIENT TEMPERATURE: 98.6
pCO2 arterial: 40.1 mmHg (ref 32.0–48.0)
pH, Arterial: 7.431 (ref 7.350–7.450)
pO2, Arterial: 101 mmHg (ref 83.0–108.0)

## 2017-10-11 LAB — COMPREHENSIVE METABOLIC PANEL
ALBUMIN: 3.6 g/dL (ref 3.5–5.0)
ALT: 15 U/L (ref 14–54)
AST: 20 U/L (ref 15–41)
Alkaline Phosphatase: 84 U/L (ref 38–126)
Anion gap: 12 (ref 5–15)
BUN: 15 mg/dL (ref 6–20)
CHLORIDE: 109 mmol/L (ref 101–111)
CO2: 21 mmol/L — ABNORMAL LOW (ref 22–32)
Calcium: 8.8 mg/dL — ABNORMAL LOW (ref 8.9–10.3)
Creatinine, Ser: 1.25 mg/dL — ABNORMAL HIGH (ref 0.44–1.00)
GFR calc Af Amer: 50 mL/min — ABNORMAL LOW (ref >60)
GFR, EST NON AFRICAN AMERICAN: 43 mL/min — AB (ref >60)
Glucose, Bld: 123 mg/dL — ABNORMAL HIGH (ref 65–99)
POTASSIUM: 3.9 mmol/L (ref 3.5–5.1)
SODIUM: 142 mmol/L (ref 135–145)
Total Bilirubin: 0.4 mg/dL (ref 0.3–1.2)
Total Protein: 6.2 g/dL — ABNORMAL LOW (ref 6.5–8.1)

## 2017-10-11 LAB — CBC
HCT: 36.8 % (ref 36.0–46.0)
Hemoglobin: 11.6 g/dL — ABNORMAL LOW (ref 12.0–15.0)
MCH: 28.8 pg (ref 26.0–34.0)
MCHC: 31.5 g/dL (ref 30.0–36.0)
MCV: 91.3 fL (ref 78.0–100.0)
PLATELETS: 281 10*3/uL (ref 150–400)
RBC: 4.03 MIL/uL (ref 3.87–5.11)
RDW: 13.9 % (ref 11.5–15.5)
WBC: 4.9 10*3/uL (ref 4.0–10.5)

## 2017-10-11 LAB — PREPARE RBC (CROSSMATCH)

## 2017-10-11 LAB — PROTIME-INR
INR: 0.98
PROTHROMBIN TIME: 12.9 s (ref 11.4–15.2)

## 2017-10-11 LAB — SURGICAL PCR SCREEN
MRSA, PCR: NEGATIVE
Staphylococcus aureus: NEGATIVE

## 2017-10-11 LAB — ABO/RH: ABO/RH(D): A POS

## 2017-10-11 LAB — APTT: aPTT: 28 seconds (ref 24–36)

## 2017-10-11 NOTE — Progress Notes (Signed)
PCP - Nolene Ebbs- last office note and EKG requested Cardiologist - pt denies cardiologist   EKG - requested from PCP, DOS if not received Cardiac Cath - 2012 Dr. Angelena Form Aspirin Instructions: left message with Dr. Claretha Cooper nurse to call pt if she needs to stop taking Aspirin. Pt reports no instructions given.   Anesthesia review: follow up on EKG result.   Patient denies shortness of breath, fever, cough and chest pain at PAT appointment   Patient verbalized understanding of instructions that were given to them at the PAT appointment. Patient was also instructed that they will need to review over the PAT instructions again at home before surgery.

## 2017-10-12 NOTE — Progress Notes (Signed)
Anesthesia Chart Review:  Pt is a 67 year old female scheduled for L common iliac to femoral artery bypass graft, possible femoral-femoral artery bypass on 10/13/2017 Servando Snare, MD  - PCP is Nolene Ebbs, MD - Cardiologist is Kela Millin, MD who cleared pt for surgery.  Last office visit 09/29/17  PMH includes:  HTN, pulmonary hypertension, GERD. Former smoker (quit 08/13/17).   Medications include: amlodipine, ASA 81mg , metoprolol, protonix  BP (!) 149/81   Pulse 71   Temp 36.7 C   Resp 20   Ht 5' (1.524 m)   Wt 186 lb (84.4 kg)   SpO2 97%   BMI 36.33 kg/m   Preoperative labs reviewed.    EKG 05/26/17 [facility] cardiovascular): Sinus rhythm.  Borderline RA dilatation.  Frequent PACs.  Nuclear stress test 09/26/17 [facility] cardiovascular): 1.  Resting EKG demonstrated NSR, normal resting conduction, no resting arrhythmias, and normal rest repolarization.  Stress EKG nondiagnostic for ischemia as it is a pharmacologic stress test.  Stress symptoms included dizziness. 2.  Myocardial perfusion imaging is normal.  Overall LV systolic function normal without regional wall motion abnormalities.  LVEF 73%.  This is a low risk study.  Echo 06/28/17 (correspondence 09/21/17 in media tab): 1.  LV cavity normal in size.  Mild concentric LVH.  Normal global wall motion.  Elevated LAP.  Grade II diastolic dysfunction.  Calculated EF 65%. 2.  RA cavity slightly dilated. 3.  Moderate tricuspid regurgitation.  Pulmonary artery systolic pressure estimated at 40-45 mmHg.  If no changes, I anticipate pt can proceed with surgery as scheduled.   Willeen Cass, FNP-BC [facility] Short Stay Surgical Center/Anesthesiology Phone: [phone] 10/12/2017 4:21 PM

## 2017-10-12 NOTE — Anesthesia Preprocedure Evaluation (Addendum)
Anesthesia Evaluation  Patient identified by MRN, date of birth, ID band Patient awake    Reviewed: Allergy & Precautions, NPO status , Patient's Chart, lab work & pertinent test results, reviewed documented beta blocker date and time   History of Anesthesia Complications Negative for: history of anesthetic complications  Airway Mallampati: II  TM Distance: >3 FB Neck ROM: Full    Dental  (+) Poor Dentition, Missing, Dental Advisory Given   Pulmonary former smoker,    Pulmonary exam normal        Cardiovascular hypertension, Pt. on home beta blockers and Pt. on medications Normal cardiovascular exam  Nuclear stress test 09/26/17 [facility] cardiovascular): 1.  Resting EKG demonstrated NSR, normal resting conduction, no resting arrhythmias, and normal rest repolarization.  Stress EKG nondiagnostic for ischemia as it is a pharmacologic stress test.  Stress symptoms included dizziness. 2.  Myocardial perfusion imaging is normal.  Overall LV systolic function normal without regional wall motion abnormalities.  LVEF 73%.  This is a low risk study.  Echo 06/28/17 (correspondence 09/21/17 in media tab): 1.  LV cavity normal in size.  Mild concentric LVH.  Normal global wall motion.  Elevated LAP.  Grade II diastolic dysfunction.  Calculated EF 65%. 2.  RA cavity slightly dilated. 3.  Moderate tricuspid regurgitation.  Pulmonary artery systolic pressure estimated at 40-45 mmHg.      Neuro/Psych PSYCHIATRIC DISORDERS Depression negative neurological ROS     GI/Hepatic Neg liver ROS, GERD  ,  Endo/Other  Morbid obesity  Renal/GU Renal InsufficiencyRenal disease     Musculoskeletal negative musculoskeletal ROS (+)   Abdominal   Peds  Hematology negative hematology ROS (+)   Anesthesia Other Findings Day of surgery medications reviewed with the patient.  Reproductive/Obstetrics                           Anesthesia Physical Anesthesia Plan  ASA: III  Anesthesia Plan: General   Post-op Pain Management:    Induction: Intravenous  PONV Risk Score and Plan: 3 and Ondansetron, Dexamethasone and Scopolamine patch - Pre-op  Airway Management Planned: Oral ETT  Additional Equipment:   Intra-op Plan:   Post-operative Plan: Extubation in OR  Informed Consent: I have reviewed the patients History and Physical, chart, labs and discussed the procedure including the risks, benefits and alternatives for the proposed anesthesia with the patient or authorized representative who has indicated his/her understanding and acceptance.   Dental advisory given  Plan Discussed with: CRNA and Anesthesiologist  Anesthesia Plan Comments:        Anesthesia Quick Evaluation

## 2017-10-13 ENCOUNTER — Inpatient Hospital Stay (HOSPITAL_COMMUNITY)
Admission: RE | Admit: 2017-10-13 | Discharge: 2017-10-17 | DRG: 254 | Disposition: A | Discharge status: Home Health | Payer: Medicare HMO | Source: Ambulatory Visit | Attending: Vascular Surgery | Admitting: Vascular Surgery

## 2017-10-13 ENCOUNTER — Encounter (HOSPITAL_COMMUNITY)
Admission: RE | Disposition: A | Discharge status: Home Health | Payer: Self-pay | Source: Ambulatory Visit | Attending: Vascular Surgery

## 2017-10-13 ENCOUNTER — Inpatient Hospital Stay (HOSPITAL_COMMUNITY): Payer: Medicare HMO | Admitting: Anesthesiology

## 2017-10-13 ENCOUNTER — Inpatient Hospital Stay (HOSPITAL_COMMUNITY): Payer: Medicare HMO | Admitting: Emergency Medicine

## 2017-10-13 DIAGNOSIS — F17210 Nicotine dependence, cigarettes, uncomplicated: Secondary | ICD-10-CM | POA: Diagnosis present

## 2017-10-13 DIAGNOSIS — Z23 Encounter for immunization: Secondary | ICD-10-CM | POA: Diagnosis present

## 2017-10-13 DIAGNOSIS — R402413 Glasgow coma scale score 13-15, at hospital admission: Secondary | ICD-10-CM | POA: Diagnosis present

## 2017-10-13 DIAGNOSIS — Z7902 Long term (current) use of antithrombotics/antiplatelets: Secondary | ICD-10-CM | POA: Diagnosis not present

## 2017-10-13 DIAGNOSIS — I70212 Atherosclerosis of native arteries of extremities with intermittent claudication, left leg: Principal | ICD-10-CM | POA: Diagnosis present

## 2017-10-13 DIAGNOSIS — Z7982 Long term (current) use of aspirin: Secondary | ICD-10-CM

## 2017-10-13 DIAGNOSIS — I071 Rheumatic tricuspid insufficiency: Secondary | ICD-10-CM | POA: Diagnosis present

## 2017-10-13 DIAGNOSIS — Z791 Long term (current) use of non-steroidal anti-inflammatories (NSAID): Secondary | ICD-10-CM | POA: Diagnosis not present

## 2017-10-13 DIAGNOSIS — E785 Hyperlipidemia, unspecified: Secondary | ICD-10-CM | POA: Diagnosis present

## 2017-10-13 DIAGNOSIS — I739 Peripheral vascular disease, unspecified: Secondary | ICD-10-CM | POA: Diagnosis present

## 2017-10-13 DIAGNOSIS — Z882 Allergy status to sulfonamides status: Secondary | ICD-10-CM | POA: Diagnosis not present

## 2017-10-13 DIAGNOSIS — I10 Essential (primary) hypertension: Secondary | ICD-10-CM | POA: Diagnosis present

## 2017-10-13 DIAGNOSIS — I272 Pulmonary hypertension, unspecified: Secondary | ICD-10-CM | POA: Diagnosis present

## 2017-10-13 DIAGNOSIS — K219 Gastro-esophageal reflux disease without esophagitis: Secondary | ICD-10-CM | POA: Diagnosis present

## 2017-10-13 DIAGNOSIS — Z6836 Body mass index (BMI) 36.0-36.9, adult: Secondary | ICD-10-CM | POA: Diagnosis not present

## 2017-10-13 DIAGNOSIS — Z79899 Other long term (current) drug therapy: Secondary | ICD-10-CM | POA: Diagnosis not present

## 2017-10-13 DIAGNOSIS — N289 Disorder of kidney and ureter, unspecified: Secondary | ICD-10-CM | POA: Diagnosis present

## 2017-10-13 HISTORY — PX: FEMORAL-FEMORAL BYPASS GRAFT: SHX936

## 2017-10-13 LAB — TYPE AND SCREEN
ABO/RH(D): A POS
Antibody Screen: NEGATIVE
Unit division: 0
Unit division: 0

## 2017-10-13 LAB — BPAM RBC
Blood Product Expiration Date: 201902222359
Blood Product Expiration Date: 201902222359
ISSUE DATE / TIME: 201902040834
UNIT TYPE AND RH: 6200
Unit Type and Rh: 6200

## 2017-10-13 LAB — CBC
HCT: 36.6 % (ref 36.0–46.0)
HEMOGLOBIN: 11.3 g/dL — AB (ref 12.0–15.0)
MCH: 28.3 pg (ref 26.0–34.0)
MCHC: 30.9 g/dL (ref 30.0–36.0)
MCV: 91.7 fL (ref 78.0–100.0)
PLATELETS: 308 10*3/uL (ref 150–400)
RBC: 3.99 MIL/uL (ref 3.87–5.11)
RDW: 14 % (ref 11.5–15.5)
WBC: 13.3 10*3/uL — ABNORMAL HIGH (ref 4.0–10.5)

## 2017-10-13 LAB — CREATININE, SERUM
Creatinine, Ser: 1.22 mg/dL — ABNORMAL HIGH (ref 0.44–1.00)
GFR calc Af Amer: 51 mL/min — ABNORMAL LOW (ref >60)
GFR calc non Af Amer: 44 mL/min — ABNORMAL LOW (ref >60)

## 2017-10-13 SURGERY — CREATION, BYPASS, ARTERIAL, FEMORAL TO FEMORAL, USING GRAFT
Anesthesia: General | Site: Groin | Laterality: Bilateral

## 2017-10-13 MED ORDER — PHENYLEPHRINE HCL 10 MG/ML IJ SOLN
INTRAMUSCULAR | Status: DC | PRN
  Administered 2017-10-13: 120 ug via INTRAVENOUS
  Administered 2017-10-13: 80 ug via INTRAVENOUS

## 2017-10-13 MED ORDER — ROCURONIUM BROMIDE 10 MG/ML (PF) SYRINGE
PREFILLED_SYRINGE | INTRAVENOUS | Status: AC
  Filled 2017-10-13: qty 5

## 2017-10-13 MED ORDER — HYDRALAZINE HCL 20 MG/ML IJ SOLN: 5.0000 mg | INTRAMUSCULAR | Status: DC | PRN

## 2017-10-13 MED ORDER — ONDANSETRON HCL 4 MG/2ML IJ SOLN
INTRAMUSCULAR | Status: DC | PRN
  Administered 2017-10-13: 4 mg via INTRAVENOUS

## 2017-10-13 MED ORDER — PANTOPRAZOLE SODIUM 40 MG PO TBEC
80.0000 mg | DELAYED_RELEASE_TABLET | Freq: Every day | ORAL | Status: DC
  Administered 2017-10-14 – 2017-10-17 (×4): 80 mg via ORAL
  Filled 2017-10-13 (×4): qty 2

## 2017-10-13 MED ORDER — PROTAMINE SULFATE 10 MG/ML IV SOLN
INTRAVENOUS | Status: DC | PRN
  Administered 2017-10-13: 40 mg via INTRAVENOUS

## 2017-10-13 MED ORDER — MORPHINE SULFATE (PF) 4 MG/ML IV SOLN: 4.0000 mg | INTRAVENOUS | Status: DC | PRN

## 2017-10-13 MED ORDER — FENTANYL CITRATE (PF) 250 MCG/5ML IJ SOLN
INTRAMUSCULAR | Status: AC
  Filled 2017-10-13: qty 5

## 2017-10-13 MED ORDER — MIDAZOLAM HCL 5 MG/5ML IJ SOLN
INTRAMUSCULAR | Status: DC | PRN
  Administered 2017-10-13: 2 mg via INTRAVENOUS

## 2017-10-13 MED ORDER — ROCURONIUM BROMIDE 100 MG/10ML IV SOLN
INTRAVENOUS | Status: DC | PRN
  Administered 2017-10-13: 50 mg via INTRAVENOUS

## 2017-10-13 MED ORDER — ALUM & MAG HYDROXIDE-SIMETH 200-200-20 MG/5ML PO SUSP: 15.0000 mL | ORAL | Status: DC | PRN

## 2017-10-13 MED ORDER — HEMOSTATIC AGENTS (NO CHARGE) OPTIME
TOPICAL | Status: DC | PRN
  Administered 2017-10-13: 1 via TOPICAL

## 2017-10-13 MED ORDER — HYDROMORPHONE HCL 1 MG/ML IJ SOLN
0.2500 mg | INTRAMUSCULAR | Status: DC | PRN
  Administered 2017-10-13: 0.25 mg via INTRAVENOUS
  Administered 2017-10-13: 0.5 mg via INTRAVENOUS
  Administered 2017-10-13: 0.25 mg via INTRAVENOUS

## 2017-10-13 MED ORDER — METOPROLOL SUCCINATE ER 25 MG PO TB24
25.0000 mg | ORAL_TABLET | Freq: Every day | ORAL | Status: DC
  Administered 2017-10-14 – 2017-10-17 (×4): 25 mg via ORAL
  Filled 2017-10-13 (×4): qty 1

## 2017-10-13 MED ORDER — DOCUSATE SODIUM 100 MG PO CAPS
100.0000 mg | ORAL_CAPSULE | Freq: Every day | ORAL | Status: DC
  Administered 2017-10-14 – 2017-10-16 (×3): 100 mg via ORAL
  Filled 2017-10-13 (×3): qty 1

## 2017-10-13 MED ORDER — CALCIUM CARBONATE-VITAMIN D 500-200 MG-UNIT PO TABS
1.0000 | ORAL_TABLET | Freq: Every day | ORAL | Status: DC
  Administered 2017-10-13 – 2017-10-17 (×5): 1 via ORAL
  Filled 2017-10-13 (×6): qty 1

## 2017-10-13 MED ORDER — ACETAMINOPHEN 650 MG RE SUPP: 325.0000 mg | RECTAL | Status: DC | PRN

## 2017-10-13 MED ORDER — LIDOCAINE 2% (20 MG/ML) 5 ML SYRINGE
INTRAMUSCULAR | Status: AC
  Filled 2017-10-13: qty 5

## 2017-10-13 MED ORDER — ALBUTEROL SULFATE HFA 108 (90 BASE) MCG/ACT IN AERS
INHALATION_SPRAY | RESPIRATORY_TRACT | Status: DC | PRN
  Administered 2017-10-13 (×2): 2 via RESPIRATORY_TRACT

## 2017-10-13 MED ORDER — HYDROMORPHONE HCL 1 MG/ML IJ SOLN
INTRAMUSCULAR | Status: AC
  Filled 2017-10-13: qty 1

## 2017-10-13 MED ORDER — SODIUM CHLORIDE 0.9 % IV SOLN: INTRAVENOUS | Status: DC

## 2017-10-13 MED ORDER — HEPARIN SODIUM (PORCINE) 5000 UNIT/ML IJ SOLN
5000.0000 [IU] | Freq: Three times a day (TID) | INTRAMUSCULAR | Status: DC
  Administered 2017-10-13 – 2017-10-17 (×12): 5000 [IU] via SUBCUTANEOUS
  Filled 2017-10-13 (×12): qty 1

## 2017-10-13 MED ORDER — AMLODIPINE BESYLATE 10 MG PO TABS
10.0000 mg | ORAL_TABLET | Freq: Every day | ORAL | Status: DC
  Administered 2017-10-14 – 2017-10-17 (×4): 10 mg via ORAL
  Filled 2017-10-13 (×4): qty 1

## 2017-10-13 MED ORDER — SUGAMMADEX SODIUM 200 MG/2ML IV SOLN
INTRAVENOUS | Status: DC | PRN
  Administered 2017-10-13: 170 mg via INTRAVENOUS

## 2017-10-13 MED ORDER — ASPIRIN EC 81 MG PO TBEC
81.0000 mg | DELAYED_RELEASE_TABLET | Freq: Every day | ORAL | Status: DC
  Administered 2017-10-13 – 2017-10-17 (×5): 81 mg via ORAL
  Filled 2017-10-13 (×4): qty 1

## 2017-10-13 MED ORDER — SODIUM CHLORIDE 0.9 % IV SOLN
INTRAVENOUS | Status: DC | PRN
  Administered 2017-10-13: 08:00:00

## 2017-10-13 MED ORDER — PROTAMINE SULFATE 10 MG/ML IV SOLN
INTRAVENOUS | Status: AC
  Filled 2017-10-13: qty 5

## 2017-10-13 MED ORDER — SCOPOLAMINE 1 MG/3DAYS TD PT72
MEDICATED_PATCH | TRANSDERMAL | Status: AC
  Filled 2017-10-13: qty 1

## 2017-10-13 MED ORDER — SODIUM CHLORIDE 0.9 % IV SOLN
1.5000 g | INTRAVENOUS | Status: AC
  Administered 2017-10-13: 1.5 g via INTRAVENOUS

## 2017-10-13 MED ORDER — IBUPROFEN 400 MG PO TABS: 400.0000 mg | ORAL_TABLET | Freq: Four times a day (QID) | ORAL | Status: DC | PRN

## 2017-10-13 MED ORDER — SODIUM CHLORIDE 0.9 % IV SOLN: 500.0000 mL | Freq: Once | INTRAVENOUS | Status: DC | PRN

## 2017-10-13 MED ORDER — SODIUM CHLORIDE 0.9 % IV SOLN
INTRAVENOUS | Status: DC
  Administered 2017-10-13: 13:00:00 via INTRAVENOUS

## 2017-10-13 MED ORDER — FENTANYL CITRATE (PF) 100 MCG/2ML IJ SOLN
INTRAMUSCULAR | Status: DC | PRN
  Administered 2017-10-13: 100 ug via INTRAVENOUS
  Administered 2017-10-13 (×4): 50 ug via INTRAVENOUS

## 2017-10-13 MED ORDER — MIDAZOLAM HCL 2 MG/2ML IJ SOLN
INTRAMUSCULAR | Status: AC
  Filled 2017-10-13: qty 2

## 2017-10-13 MED ORDER — LIDOCAINE HCL (CARDIAC) 20 MG/ML IV SOLN
INTRAVENOUS | Status: DC | PRN
  Administered 2017-10-13: 100 mg via INTRAVENOUS

## 2017-10-13 MED ORDER — ACETAMINOPHEN 325 MG PO TABS: 325.0000 mg | ORAL_TABLET | ORAL | Status: DC | PRN

## 2017-10-13 MED ORDER — ATORVASTATIN CALCIUM 80 MG PO TABS
80.0000 mg | ORAL_TABLET | Freq: Every day | ORAL | Status: DC
  Administered 2017-10-14 – 2017-10-17 (×4): 80 mg via ORAL
  Filled 2017-10-13 (×4): qty 1

## 2017-10-13 MED ORDER — OXYCODONE-ACETAMINOPHEN 5-325 MG PO TABS
1.0000 | ORAL_TABLET | ORAL | Status: DC | PRN
  Administered 2017-10-13: 2 via ORAL
  Administered 2017-10-13: 1 via ORAL
  Administered 2017-10-13: 2 via ORAL
  Administered 2017-10-14 – 2017-10-15 (×4): 1 via ORAL
  Administered 2017-10-15: 2 via ORAL
  Administered 2017-10-16 – 2017-10-17 (×2): 1 via ORAL
  Filled 2017-10-13 (×4): qty 1
  Filled 2017-10-13 (×2): qty 2
  Filled 2017-10-13: qty 1
  Filled 2017-10-13: qty 2
  Filled 2017-10-13 (×2): qty 1

## 2017-10-13 MED ORDER — SODIUM CHLORIDE 0.9 % IV SOLN
INTRAVENOUS | Status: AC
  Filled 2017-10-13: qty 1.5

## 2017-10-13 MED ORDER — EPHEDRINE SULFATE-NACL 50-0.9 MG/10ML-% IV SOSY
PREFILLED_SYRINGE | INTRAVENOUS | Status: DC | PRN
  Administered 2017-10-13: 5 mg via INTRAVENOUS

## 2017-10-13 MED ORDER — LACTATED RINGERS IV SOLN
INTRAVENOUS | Status: DC | PRN
  Administered 2017-10-13 (×2): via INTRAVENOUS

## 2017-10-13 MED ORDER — LABETALOL HCL 5 MG/ML IV SOLN: 10.0000 mg | INTRAVENOUS | Status: DC | PRN

## 2017-10-13 MED ORDER — DEXAMETHASONE SODIUM PHOSPHATE 10 MG/ML IJ SOLN
INTRAMUSCULAR | Status: DC | PRN
  Administered 2017-10-13: 10 mg via INTRAVENOUS

## 2017-10-13 MED ORDER — PROMETHAZINE HCL 25 MG/ML IJ SOLN: 6.2500 mg | INTRAMUSCULAR | Status: DC | PRN

## 2017-10-13 MED ORDER — HEPARIN SODIUM (PORCINE) 1000 UNIT/ML IJ SOLN
INTRAMUSCULAR | Status: DC | PRN
  Administered 2017-10-13: 9000 [IU] via INTRAVENOUS

## 2017-10-13 MED ORDER — GUAIFENESIN-DM 100-10 MG/5ML PO SYRP: 15.0000 mL | ORAL_SOLUTION | ORAL | Status: DC | PRN

## 2017-10-13 MED ORDER — DEXAMETHASONE SODIUM PHOSPHATE 10 MG/ML IJ SOLN
INTRAMUSCULAR | Status: AC
  Filled 2017-10-13: qty 1

## 2017-10-13 MED ORDER — ONDANSETRON HCL 4 MG/2ML IJ SOLN: 4.0000 mg | Freq: Four times a day (QID) | INTRAMUSCULAR | Status: DC | PRN

## 2017-10-13 MED ORDER — PHENOL 1.4 % MT LIQD: 1.0000 | OROMUCOSAL | Status: DC | PRN

## 2017-10-13 MED ORDER — CHLORHEXIDINE GLUCONATE CLOTH 2 % EX PADS: 6.0000 | MEDICATED_PAD | Freq: Once | CUTANEOUS | Status: DC

## 2017-10-13 MED ORDER — PHENYLEPHRINE 40 MCG/ML (10ML) SYRINGE FOR IV PUSH (FOR BLOOD PRESSURE SUPPORT)
PREFILLED_SYRINGE | INTRAVENOUS | Status: AC
  Filled 2017-10-13: qty 10

## 2017-10-13 MED ORDER — POTASSIUM CHLORIDE CRYS ER 20 MEQ PO TBCR: 20.0000 meq | EXTENDED_RELEASE_TABLET | Freq: Every day | ORAL | Status: DC | PRN

## 2017-10-13 MED ORDER — PROPOFOL 10 MG/ML IV BOLUS
INTRAVENOUS | Status: AC
  Filled 2017-10-13: qty 20

## 2017-10-13 MED ORDER — SCOPOLAMINE 1 MG/3DAYS TD PT72
1.0000 | MEDICATED_PATCH | TRANSDERMAL | Status: DC
  Administered 2017-10-13: 1.5 mg via TRANSDERMAL

## 2017-10-13 MED ORDER — SUGAMMADEX SODIUM 200 MG/2ML IV SOLN
INTRAVENOUS | Status: AC
  Filled 2017-10-13: qty 2

## 2017-10-13 MED ORDER — MAGNESIUM SULFATE 2 GM/50ML IV SOLN: 2.0000 g | Freq: Every day | INTRAVENOUS | Status: DC | PRN

## 2017-10-13 MED ORDER — PHENYLEPHRINE HCL 10 MG/ML IJ SOLN
INTRAVENOUS | Status: DC | PRN
  Administered 2017-10-13: 40 ug/min via INTRAVENOUS

## 2017-10-13 MED ORDER — ONDANSETRON HCL 4 MG/2ML IJ SOLN
INTRAMUSCULAR | Status: AC
  Filled 2017-10-13: qty 2

## 2017-10-13 MED ORDER — METOPROLOL TARTRATE 5 MG/5ML IV SOLN: 2.0000 mg | INTRAVENOUS | Status: DC | PRN

## 2017-10-13 MED ORDER — SODIUM CHLORIDE 0.9 % IV SOLN
1.5000 g | Freq: Two times a day (BID) | INTRAVENOUS | Status: AC
  Administered 2017-10-13 – 2017-10-14 (×2): 1.5 g via INTRAVENOUS
  Filled 2017-10-13 (×2): qty 1.5

## 2017-10-13 MED ORDER — PROPOFOL 10 MG/ML IV BOLUS
INTRAVENOUS | Status: DC | PRN
  Administered 2017-10-13: 20 mg via INTRAVENOUS
  Administered 2017-10-13: 50 mg via INTRAVENOUS
  Administered 2017-10-13: 100 mg via INTRAVENOUS

## 2017-10-13 MED ORDER — 0.9 % SODIUM CHLORIDE (POUR BTL) OPTIME
TOPICAL | Status: DC | PRN
  Administered 2017-10-13: 2000 mL

## 2017-10-13 MED ORDER — POLYETHYLENE GLYCOL 3350 17 G PO PACK: 17.0000 g | PACK | Freq: Every day | ORAL | Status: DC | PRN

## 2017-10-13 SURGICAL SUPPLY — 52 items
CANISTER SUCT 3000ML PPV (MISCELLANEOUS) ×2 IMPLANT
CLIP VESOCCLUDE MED 24/CT (CLIP) ×2 IMPLANT
CLIP VESOCCLUDE SM WIDE 24/CT (CLIP) ×2 IMPLANT
DERMABOND ADVANCED (GAUZE/BANDAGES/DRESSINGS) ×2
DERMABOND ADVANCED .7 DNX12 (GAUZE/BANDAGES/DRESSINGS) ×2 IMPLANT
ELECT BLADE 4.0 EZ CLEAN MEGAD (MISCELLANEOUS) ×2
ELECT BLADE 6.5 EXT (BLADE) IMPLANT
ELECT REM PT RETURN 9FT ADLT (ELECTROSURGICAL) ×2
ELECTRODE BLDE 4.0 EZ CLN MEGD (MISCELLANEOUS) ×1 IMPLANT
ELECTRODE REM PT RTRN 9FT ADLT (ELECTROSURGICAL) ×1 IMPLANT
GLOVE BIO SURGEON STRL SZ 6.5 (GLOVE) ×2 IMPLANT
GLOVE BIO SURGEON STRL SZ7 (GLOVE) ×4 IMPLANT
GLOVE BIO SURGEON STRL SZ7.5 (GLOVE) ×2 IMPLANT
GLOVE BIOGEL PI IND STRL 7.0 (GLOVE) ×1 IMPLANT
GLOVE BIOGEL PI INDICATOR 7.0 (GLOVE) ×1
GLOVE SURG SS PI 7.0 STRL IVOR (GLOVE) ×2 IMPLANT
GOWN STRL REUS W/ TWL LRG LVL3 (GOWN DISPOSABLE) ×2 IMPLANT
GOWN STRL REUS W/ TWL XL LVL3 (GOWN DISPOSABLE) ×2 IMPLANT
GOWN STRL REUS W/TWL LRG LVL3 (GOWN DISPOSABLE) ×2
GOWN STRL REUS W/TWL XL LVL3 (GOWN DISPOSABLE) ×2
GRAFT HEMASHIELD 8MM (Vascular Products) ×1 IMPLANT
GRAFT VASC STRG 30X8KNIT (Vascular Products) ×1 IMPLANT
HEMOSTAT SNOW SURGICEL 2X4 (HEMOSTASIS) ×2 IMPLANT
INSERT FOGARTY 61MM (MISCELLANEOUS) IMPLANT
INSERT FOGARTY SM (MISCELLANEOUS) ×2 IMPLANT
KIT BASIN OR (CUSTOM PROCEDURE TRAY) ×2 IMPLANT
KIT ROOM TURNOVER OR (KITS) ×2 IMPLANT
NS IRRIG 1000ML POUR BTL (IV SOLUTION) ×4 IMPLANT
PACK AORTA (CUSTOM PROCEDURE TRAY) ×2 IMPLANT
PAD ARMBOARD 7.5X6 YLW CONV (MISCELLANEOUS) ×4 IMPLANT
RETAINER VISCERA MED (MISCELLANEOUS) IMPLANT
SUT ETHIBOND 5 LR DA (SUTURE) IMPLANT
SUT MNCRL AB 4-0 PS2 18 (SUTURE) ×4 IMPLANT
SUT PDS AB 1 TP1 54 (SUTURE) IMPLANT
SUT PROLENE 3 0 SH 48 (SUTURE) IMPLANT
SUT PROLENE 5 0 C 1 24 (SUTURE) ×4 IMPLANT
SUT PROLENE 5 0 C 1 36 (SUTURE) ×2 IMPLANT
SUT SILK 2 0 (SUTURE) ×1
SUT SILK 2 0SH CR/8 30 (SUTURE) ×2 IMPLANT
SUT SILK 2-0 18XBRD TIE 12 (SUTURE) ×1 IMPLANT
SUT SILK 3 0 (SUTURE) ×1
SUT SILK 3-0 18XBRD TIE 12 (SUTURE) ×1 IMPLANT
SUT SILK 4 0 (SUTURE) ×1
SUT SILK 4-0 18XBRD TIE 12 (SUTURE) ×1 IMPLANT
SUT VIC AB 2-0 CT1 27 (SUTURE) ×2
SUT VIC AB 2-0 CT1 TAPERPNT 27 (SUTURE) ×2 IMPLANT
SUT VIC AB 3-0 SH 27 (SUTURE) ×2
SUT VIC AB 3-0 SH 27X BRD (SUTURE) ×2 IMPLANT
TOWEL BLUE STERILE X RAY DET (MISCELLANEOUS) IMPLANT
TOWEL GREEN STERILE (TOWEL DISPOSABLE) ×2 IMPLANT
TRAY FOLEY MTR SLVR 16FR STAT (CATHETERS) ×2 IMPLANT
WATER STERILE IRR 1000ML POUR (IV SOLUTION) ×4 IMPLANT

## 2017-10-13 NOTE — H&P (Signed)
Reason for Consult:  claudication Referring Physician:  Dr. Einar Gip MRN #:  [id number]  History of Present Illness: This is a 67 y.o. female with history of hypertension hyperlipidemia and previously was a smoker but quit in December.  She is a patient of Dr. Einar Gip with pulmonary hypertension as well as mild tricuspid regurgitation.  She does have right lower extremity swelling and left lower extremity pain when she walks.  She does walk significant amount for her job.  She denies rest pain tissue loss.  She has never had lower extremity intervention before.  She underwent angiogram the day of this consult.  She does take aspirin and a statin drug.      Past Medical History:  Diagnosis Date  . Hypertension     Surgical history: hernia repair.       Allergies  Allergen Reactions  . Sulfonamide Derivatives Other (See Comments)    Swollen eyes            Prior to Admission medications   Medication Sig Start Date End Date Taking? Authorizing Provider  acetaminophen (TYLENOL) 500 MG tablet Take 1,000-1,500 mg by mouth every 6 (six) hours as needed for mild pain or headache.    Yes [provider]  amLODipine (NORVASC) 10 MG tablet Take 10 mg by mouth daily.  04/14/16  Yes [provider]  aspirin EC 81 MG tablet Take 81 mg by mouth daily.   Yes [provider]  Calcium Carb-Cholecalciferol (CALCIUM-VITAMIN D3) 600-400 MG-UNIT TABS Take 1 tablet by mouth daily.   Yes [provider]  cilostazol (PLETAL) 50 MG tablet Take 50 mg by mouth 2 (two) times daily. 08/10/17  Yes [provider]  diclofenac (CATAFLAM) 50 MG tablet Take 50 mg by mouth daily.  12/25/16  Yes [provider]  pantoprazole (PROTONIX) 40 MG tablet Take 80 mg by mouth daily.  02/07/17  Yes [provider]  rosuvastatin (CRESTOR) 20 MG tablet Take 20 mg by mouth daily.   Yes [provider]    Social History          Socioeconomic History  . Marital status: Divorced    Spouse name: Not on file  . Number of children: Not on file  . Years of education: Not on file  . Highest education level: Not on file  Social Needs  . Financial resource strain: Not on file  . Food insecurity - worry: Not on file  . Food insecurity - inability: Not on file  . Transportation needs - medical: Not on file  . Transportation needs - non-medical: Not on file  Occupational History  . Not on file  Tobacco Use  . Smoking status: Current Every Day Smoker    Packs/day: 0.50    Types: Cigarettes  . Smokeless tobacco: Never Used  Substance and Sexual Activity  . Alcohol use: No  . Drug use: No  . Sexual activity: Not on file  Other Topics Concern  . Not on file  Social History Narrative  . Not on file     No family history on file.  REVIEW OF SYSTEMS (negative unless checked):   Cardiac:  []  Chest pain or chest pressure? []  Shortness of breath upon activity? []  Shortness of breath when lying flat? []  Irregular heart rhythm?  Vascular:  [x]  Pain in calf, thigh, or hip brought on by walking? []  Pain in feet at night that wakes you up from your sleep? []  Blood clot in your veins? [  x] Leg swelling?  Pulmonary:  []  Oxygen at home? []  Productive cough? []  Wheezing?  Neurologic:  []  Sudden weakness in arms or legs? []  Sudden numbness in arms or legs? []  Sudden onset of difficult speaking or slurred speech? []  Temporary loss of vision in one eye? []  Problems with dizziness?  Gastrointestinal:  []  Blood in stool? []  Vomited blood?  Genitourinary:  []  Burning when urinating? []  Blood in urine?  Psychiatric:  []  Major depression  Hematologic:  []  Bleeding problems? []  Problems with blood clotting?  Dermatologic:  []  Rashes or ulcers?  Constitutional:  []  Fever or chills?  Ear/Nose/Throat:  []  Change in hearing? []  Nose bleeds? []  Sore throat?  Musculoskeletal:   []  Back pain? []  Joint pain? []  Muscle pain?   Physical Examination      Vitals:   09/20/17 0848 09/20/17 0853  BP:    Pulse: (!) 0 (!) 134  Resp: (!) 0 (!) 0  Temp:    SpO2: (!) 0% (!) 0%   Body mass index is 35.14 kg/m.  General:  WDWN in NAD HENT: WNL, normocephalic Pulmonary: normal non-labored breathing Cardiac: rrr, palpable right femoral and popliteal pulses No palpable left lower extremity pulses Abdomen: soft, NT/ND, no masses; well healed low midline incision Extremities: no wounds Musculoskeletal: no muscle wasting or atrophy       Neurologic: A&O X 3; SENSATION: normal; MOTOR FUNCTION:  moving all extremities equally. Speech is fluent/normal Psychiatric: appropriate mood and affect  CBC Labs(Brief)          Component Value Date/Time   WBC 11.4 (H) 05/16/2016 2336   RBC 4.51 05/16/2016 2336   HGB 13.3 05/16/2016 2336   HCT 40.9 05/16/2016 2336   PLT 293 05/16/2016 2336   MCV 90.7 05/16/2016 2336   MCH 29.5 05/16/2016 2336   MCHC 32.5 05/16/2016 2336   RDW 14.9 05/16/2016 2336   LYMPHSABS 1.6 05/16/2016 2336   MONOABS 0.8 05/16/2016 2336   EOSABS 0.0 05/16/2016 2336   BASOSABS 0.0 05/16/2016 2336      BMET Labs(Brief)          Component Value Date/Time   NA 138 05/16/2016 2336   K 3.8 05/16/2016 2336   CL 102 05/16/2016 2336   CO2 29 05/16/2016 2336   GLUCOSE 106 (H) 05/16/2016 2336   BUN 23 (H) 05/16/2016 2336   CREATININE 1.78 (H) 05/16/2016 2336   CALCIUM 8.9 05/16/2016 2336   GFRNONAA 28 (L) 05/16/2016 2336   GFRAA 33 (L) 05/16/2016 2336      COAGS: RecentLabs       Lab Results  Component Value Date   INR 1.0 08/01/2008   INR 0.9 08/01/2008       Non-Invasive Vascular Imaging:      ASSESSMENT/PLAN: This is a 67 y.o. female with life limiting claudication on the left. Will plan right to left fem-fem today in OR.   Sutter Ahlgren C. Donzetta Matters, MD [facility]  [place] Office: [phone] Pager: [phone]

## 2017-10-13 NOTE — Anesthesia Procedure Notes (Signed)
Procedure Name: Intubation Date/Time: 10/13/2017 7:39 AM Performed by: Lance Coon, CRNA Pre-anesthesia Checklist: Patient identified, Emergency Drugs available, Suction available, Patient being monitored and Timeout performed Patient Re-evaluated:Patient Re-evaluated prior to induction Oxygen Delivery Method: Circle system utilized Preoxygenation: Pre-oxygenation with 100% oxygen Induction Type: IV induction Ventilation: Mask ventilation without difficulty Laryngoscope Size: Miller and 3 Grade View: Grade I Tube type: Oral Tube size: 7.5 mm Number of attempts: 1 Airway Equipment and Method: Stylet Placement Confirmation: ETT inserted through vocal cords under direct vision,  positive ETCO2 and breath sounds checked- equal and bilateral Secured at: 21 cm Tube secured with: Tape Dental Injury: Teeth and Oropharynx as per pre-operative assessment

## 2017-10-13 NOTE — Transfer of Care (Signed)
Immediate Anesthesia Transfer of Care Note  Patient: Kristy Baxter  Procedure(s) Performed: BYPASS GRAFT FEMORAL-FEMORAL ARTERY USING 6MM X 30CM HEMASHIELD GOLD VASCULAR GRAFT (Bilateral Groin)  Patient Location: PACU  Anesthesia Type:General  Level of Consciousness: awake and patient cooperative  Airway & Oxygen Therapy: Patient Spontanous Breathing and Patient connected to face mask oxygen  Post-op Assessment: Report given to RN and Post -op Vital signs reviewed and stable  Post vital signs: Reviewed and stable  Last Vitals:  Vitals:   10/13/17 0544  BP: (!) 150/82  Pulse: (!) 53  Resp: 20  Temp: 36.7 C  SpO2: 97%    Last Pain:  Vitals:   10/13/17 0544  TempSrc: Oral      Patients Stated Pain Goal: 3 (38/17/71 1657)  Complications: No apparent anesthesia complications

## 2017-10-13 NOTE — Op Note (Signed)
Patient name: Kristy Baxter MRN: [id number] DOB: Oct 31, 1950 Sex: female  10/13/2017 Pre-operative Diagnosis: left lower extremity claudication Post-operative diagnosis:  Same Surgeon:  Erlene Quan C. Donzetta Matters, MD Assistant: Arlee Muslim, PA Procedure Performed: Right to left femoral to femoral bypass graft with 67mm dacron  Indications: 67 year old female with history of life limiting left lower extremity claudication.  She recently underwent angiogram demonstrating occluded left external iliac artery reconstituting a common femoral artery on the left.  Her right common iliac and external iliac and common femoral artery appeared free of disease.  She is therefore indicated for femoral to femoral bypass or originating from the right side to the left side.  Findings: The left common femoral artery was diminutive there were large collaterals.  There was an occlusive plaque proximally in the external iliac artery which was removed but there is no antegrade bleeding following this.  The right common femoral artery was free of disease.  After right to left femoral-femoral bypass there were multiphasic signals in the bilateral initial femoral arteries on the left and became monophasic with compression of the graft.   Procedure:  The patient was identified in the holding area and taken to the operating room where she was placed supine on the operative table and general anesthesia was induced.  She was sterilely prepped and draped in her abdomen bilateral groins given antibiotics and timeout called.  We began with longitudinal incision in the left groin overlying where the common femoral artery should be.  We dissected down onto the inguinal ligament and could then palpate our external iliac artery which we dissected out.  We placed Vesseloops around branches we divided the crossing vein placed a vessel loop around the external iliac artery followed by the profunda femoris and superficial femoral arteries.  We then  turned our attention to the right side.  A similar incision was made we carried down onto the palpable femoral pulse there was significant adhesions there probably from her recent catheterization.  We able to dissect out the profunda the SFA and then the external iliac artery.  We divided the crossing vein as well as the vein crossing the profunda similar to the left side.  We placed Vesseloops around all of our vessels.  We created a tunnel bluntly from the 2 incisions and tunneled an 8 mm Dacron graft.  We then gave the patient 8000 units of heparin.  After circulate for 3 minutes we first clamped our outflow on the right side followed by our inflow.  Longitudinal incision was made in the artery and extended with Pott scissors.  The graft was then beveled sewn end to side with 5-0 Prolene suture.  Upon completion we then flushed through the graft both antegrade and retrograde.  The graft was flushed with heparinized saline and clamped.  We then turned our attention to the left side where the outflow was clamped.  The inflow was not clamped.  A longitudinal incision was made similarly and extended with Potts scissors.  There was a chronic soft plaque extending up into the external iliac artery and this was removed but there was no antegrade bleeding following this.  We then beveled the graft similarly and sewed end to side with  5-0 Prolene suture.  Prior to completing we allowed backbleeding and antegrade bleeding to the graft we then flushed with heparinized saline completed the anastomosis.  Upon completion we did have palpable SFA pulses bilaterally confirmed with Doppler.  On the left side it was multiphasic  and reduced to monophasic with compression of the graft.  Satisfied with this the patient was given 40 mg of protamine which she tolerated well.  We then irrigated and obtained hemostasis in both wounds.  They were both closed with a series of 2-0 Vicryl followed by interrupted 3-0 Vicryl and 4-0 Monocryl.   Dermabond was placed level the skin.  Patient did tolerate procedure well without immediate complication.  All counts were correct at completion.  EBL 200 cc.    [name], MD [facility] [place] Office: [phone] Pager: [phone]

## 2017-10-13 NOTE — Evaluation (Signed)
Physical Therapy Evaluation Patient Details Name: Kristy Baxter MRN: [id number] DOB: 26-Sep-1950 Today's Date: 10/13/2017   History of Present Illness  68yo female with R LE edema and L LE pain with gait. She received femoral to femoral bypass graft on 10/13/17. PMH HTN   Clinical Impression  Patient received in bed, pleasant and willing to work with skilled PT services this afternoon. She is very independent and highly mobile at baseline, able to work and ambulate with no device and no recent falls. She is able to perform functional bed mobility and functional transfers with S, gait approximately 21f with min guard today and RW; gait limited by B LE fatigue. She was left up in the chair with all needs met, RN aware of patient status. Anticipate that patient will progress quickly with skilled PT services; she will likely benefit from skilled HHPT services to assist in return to PLOF moving forward.    Follow Up Recommendations Home health PT    Equipment Recommendations  Rolling walker with 5" wheels    Recommendations for Other Services       Precautions / Restrictions Precautions Precautions: Fall Restrictions Weight Bearing Restrictions: No      Mobility  Bed Mobility Overal bed mobility: Needs Assistance Bed Mobility: Supine to Sit     Supine to sit: Supervision     General bed mobility comments: increased time and effort   Transfers Overall transfer level: Needs assistance Equipment used: Rolling walker (2 wheeled) Transfers: Sit to/from Stand Sit to Stand: Supervision         General transfer comment: cues for safety, increased time   Ambulation/Gait Ambulation/Gait assistance: Min guard Ambulation Distance (Feet): 25 Feet Assistive device: Rolling walker (2 wheeled) Gait Pattern/deviations: Step-through pattern;Trunk flexed     General Gait Details: slow but steady pace, limited by LE fatigue today   Stairs            Wheelchair Mobility     Modified Rankin (Stroke Patients Only)       Balance Overall balance assessment: No apparent balance deficits (not formally assessed)                                           Pertinent Vitals/Pain Pain Assessment: No/denies pain    Home Living Family/patient expects to be discharged to:: Private residence Living Arrangements: Children;Non-relatives/Friends Available Help at Discharge: Family;Friend(s) Type of Home: House Home Access: Stairs to enter Entrance Stairs-Rails: None Entrance Stairs-Number of Steps: 3 Home Layout: One level Home Equipment: Bedside commode;Shower seat      Prior Function Level of Independence: Independent               Hand Dominance        Extremity/Trunk Assessment        Lower Extremity Assessment Lower Extremity Assessment: Generalized weakness    Cervical / Trunk Assessment Cervical / Trunk Assessment: Normal  Communication   Communication: No difficulties  Cognition Arousal/Alertness: Awake/alert Behavior During Therapy: WFL for tasks assessed/performed Overall Cognitive Status: Within Functional Limits for tasks assessed                                        General Comments      Exercises     Assessment/Plan    PT  Assessment Patient needs continued PT services  PT Problem List Decreased strength;Decreased mobility;Decreased safety awareness;Decreased coordination;Decreased activity tolerance;Decreased knowledge of use of DME       PT Treatment Interventions DME instruction;Therapeutic activities;Gait training;Therapeutic exercise;Patient/family education;Stair training;Balance training;Functional mobility training;Neuromuscular re-education    PT Goals (Current goals can be found in the Care Plan section)  Acute Rehab PT Goals Patient Stated Goal: to go home  PT Goal Formulation: With patient Time For Goal Achievement: 10/27/17 Potential to Achieve Goals: Good     Frequency Min 3X/week   Barriers to discharge        Co-evaluation               AM-PAC PT "6 Clicks" Daily Activity  Outcome Measure Difficulty turning over in bed (including adjusting bedclothes, sheets and blankets)?: None Difficulty moving from lying on back to sitting on the side of the bed? : None Difficulty sitting down on and standing up from a chair with arms (e.g., wheelchair, bedside commode, etc,.)?: None Help needed moving to and from a bed to chair (including a wheelchair)?: A Little Help needed walking in hospital room?: A Little Help needed climbing 3-5 steps with a railing? : A Lot 6 Click Score: 20    End of Session Equipment Utilized During Treatment: Gait belt Activity Tolerance: Patient tolerated treatment well Patient left: in chair;with call bell/phone within reach Nurse Communication: Mobility status PT Visit Diagnosis: Muscle weakness (generalized) (M62.81);Difficulty in walking, not elsewhere classified (R26.2)    Time: 7741-2878 PT Time Calculation (min) (ACUTE ONLY): 29 min   Charges:   PT Evaluation $PT Eval Low Complexity: 1 Low PT Treatments $Gait Training: 8-22 mins   PT G Codes:        Deniece Ree PT, DPT, CBIS  Supplemental Physical Therapist [place]   Pager [phone]

## 2017-10-13 NOTE — Progress Notes (Signed)
Care of pt assumed by MA Graviela Nodal RN 

## 2017-10-13 NOTE — Progress Notes (Signed)
  Day of Surgery Note    Subjective:  No complaints except a little soreness in her groins   Vitals:   10/13/17 1339 10/13/17 1409  BP: (!) 136/92 125/84  Pulse:  82  Resp: 10 18  Temp: 98.1 F (36.7 C) 98.2 F (36.8 C)  SpO2: 97% 97%    Incisions:   Bilateral groins are clean and dry without hematoma Extremities:  +palpable right DP pusle; +doppler signal left DP pulse Cardiac:  regular Lungs:  Non labored    Assessment/Plan:  This is a 67 y.o. female who is s/p  Right to left femoral to femoral bypass graft with 49mm dacron    -pt doing well with palpable right DP and + doppler signal left DP -groin incisions are clean and dry  -dry gauze to groins to wick moisture to help prevent wound infection-discussed with pt.   Leontine Locket, PA-C 10/13/2017 2:24 PM [phone]

## 2017-10-13 NOTE — Anesthesia Postprocedure Evaluation (Signed)
Anesthesia Post Note  Patient: Kristy Baxter  Procedure(s) Performed: BYPASS GRAFT FEMORAL-FEMORAL ARTERY USING 6MM X 30CM HEMASHIELD GOLD VASCULAR GRAFT (Bilateral Groin)     Patient location during evaluation: PACU Anesthesia Type: General Level of consciousness: sedated Pain management: pain level controlled Vital Signs Assessment: post-procedure vital signs reviewed and stable Respiratory status: spontaneous breathing and respiratory function stable Cardiovascular status: stable Postop Assessment: no apparent nausea or vomiting Anesthetic complications: no    Last Vitals:  Vitals:   10/13/17 1202 10/13/17 1215  BP: 123/89   Pulse:  (!) 54  Resp:  16  Temp:    SpO2:  95%    Last Pain:  Vitals:   10/13/17 1215  TempSrc:   PainSc: 6                  Khaliq Turay DANIEL

## 2017-10-14 ENCOUNTER — Other Ambulatory Visit: Payer: Self-pay

## 2017-10-14 ENCOUNTER — Encounter (HOSPITAL_COMMUNITY): Payer: Self-pay | Admitting: *Deleted

## 2017-10-14 LAB — CBC
HEMATOCRIT: 32.8 % — AB (ref 36.0–46.0)
HEMOGLOBIN: 10.3 g/dL — AB (ref 12.0–15.0)
MCH: 28.8 pg (ref 26.0–34.0)
MCHC: 31.4 g/dL (ref 30.0–36.0)
MCV: 91.6 fL (ref 78.0–100.0)
Platelets: 280 10*3/uL (ref 150–400)
RBC: 3.58 MIL/uL — ABNORMAL LOW (ref 3.87–5.11)
RDW: 14.2 % (ref 11.5–15.5)
WBC: 10.9 10*3/uL — ABNORMAL HIGH (ref 4.0–10.5)

## 2017-10-14 LAB — BASIC METABOLIC PANEL
Anion gap: 11 (ref 5–15)
BUN: 16 mg/dL (ref 6–20)
CALCIUM: 8.8 mg/dL — AB (ref 8.9–10.3)
CHLORIDE: 105 mmol/L (ref 101–111)
CO2: 24 mmol/L (ref 22–32)
CREATININE: 1.34 mg/dL — AB (ref 0.44–1.00)
GFR calc Af Amer: 46 mL/min — ABNORMAL LOW (ref >60)
GFR calc non Af Amer: 39 mL/min — ABNORMAL LOW (ref >60)
Glucose, Bld: 119 mg/dL — ABNORMAL HIGH (ref 65–99)
Potassium: 5.1 mmol/L (ref 3.5–5.1)
SODIUM: 140 mmol/L (ref 135–145)

## 2017-10-14 MED ORDER — INFLUENZA VAC SPLIT HIGH-DOSE 0.5 ML IM SUSY
0.5000 mL | PREFILLED_SYRINGE | INTRAMUSCULAR | Status: AC
  Administered 2017-10-15: 0.5 mL via INTRAMUSCULAR
  Filled 2017-10-14: qty 0.5

## 2017-10-14 NOTE — Progress Notes (Addendum)
  Progress Note    10/14/2017 7:38 AM 1 Day Post-Op  Subjective:  Soreness in bilateral groin incisions   Vitals:   10/14/17 0017 10/14/17 0428  BP: 129/71 121/81  Pulse: 79 83  Resp: 18   Temp: 98 F (36.7 C) 98.8 F (37.1 C)  SpO2: 97% 95%   Physical Exam: Cardiac:  RRR Lungs:  No inc resp effort Incisions:  B groin incisions are intact, soft, and without drainage Extremities:  Palpable DP pulses bilaterally L>R Abdomen:  Soft Neurologic: A&O  CBC    Component Value Date/Time   WBC 10.9 (H) 10/14/2017 0344   RBC 3.58 (L) 10/14/2017 0344   HGB 10.3 (L) 10/14/2017 0344   HCT 32.8 (L) 10/14/2017 0344   PLT 280 10/14/2017 0344   MCV 91.6 10/14/2017 0344   MCH 28.8 10/14/2017 0344   MCHC 31.4 10/14/2017 0344   RDW 14.2 10/14/2017 0344   LYMPHSABS 1.6 05/16/2016 2336   MONOABS 0.8 05/16/2016 2336   EOSABS 0.0 05/16/2016 2336   BASOSABS 0.0 05/16/2016 2336    BMET    Component Value Date/Time   NA 140 10/14/2017 0344   K 5.1 10/14/2017 0344   CL 105 10/14/2017 0344   CO2 24 10/14/2017 0344   GLUCOSE 119 (H) 10/14/2017 0344   BUN 16 10/14/2017 0344   CREATININE 1.34 (H) 10/14/2017 0344   CALCIUM 8.8 (L) 10/14/2017 0344   GFRNONAA 39 (L) 10/14/2017 0344   GFRAA 46 (L) 10/14/2017 0344    INR    Component Value Date/Time   INR 0.98 10/11/2017 1015     Intake/Output Summary (Last 24 hours) at 10/14/2017 0738 Last data filed at 10/14/2017 2229 Gross per 24 hour  Intake 3523.33 ml  Output 1130 ml  Net 2393.33 ml     Assessment/Plan:  67 y.o. female is s/p R to L fem-fem bypass with Dacron 1 Day Post-Op   Continue aspirin and statin Encouraged OOB Case management consulted for home health PT needs D/c home when pain controlled and mobility improved  DVT prophylaxis:  subq heparin    Dagoberto Ligas, PA-C [facility] [phone] 10/14/2017 7:38 AM   I have independently interviewed and examined patient and agree with PA  assessment and plan above.   Kerem Gilmer C. Donzetta Matters, MD [facility] [place] Office: [phone] Pager: [phone]

## 2017-10-14 NOTE — Evaluation (Signed)
Occupational Therapy Evaluation and Discharge from OT Patient Details Name: Kristy Baxter MRN: [id number] DOB: 1951/03/18 Today's Date: 10/14/2017    History of Present Illness 67yo female with R LE edema and L LE pain with gait. She received femoral to femoral bypass graft on 10/13/17. PMH HTN    Clinical Impression   PTA Pt independent in ADL and mobility. Pt is currently supervision for most ADL requiring min A for LB ADL and supervision for mobility with RW. Pt able to reach lower extremities in long sitting and she will have assist from sons and their girlfriend upon return home. Education provided in compensatory strategies for ADL as well as helpful AE (long handle sponge and grabber/reacher). Pt with no questions or concerns at the end of session after explanation of 3 in 1 and uses (Pt will require 3 in 1 as she has a low toilet seat) Education complete. OT to sign off at this point. Thank you for the opportunity to serve this patient.     Follow Up Recommendations  No OT follow up;Supervision - Intermittent    Equipment Recommendations  3 in 1 bedside commode    Recommendations for Other Services       Precautions / Restrictions Precautions Precautions: Fall Restrictions Weight Bearing Restrictions: No      Mobility Bed Mobility Overal bed mobility: Needs Assistance Bed Mobility: Sit to Supine       Sit to supine: Supervision   General bed mobility comments: increased time and effort   Transfers Overall transfer level: Needs assistance Equipment used: Rolling walker (2 wheeled) Transfers: Sit to/from Stand Sit to Stand: Supervision         General transfer comment: cues for safety, increased time     Balance Overall balance assessment: No apparent balance deficits (not formally assessed)                                         ADL either performed or assessed with clinical judgement   ADL Overall ADL's : Needs  assistance/impaired Eating/Feeding: Modified independent   Grooming: Wash/dry hands;Wash/dry face;Oral care;Supervision/safety;Standing Grooming Details (indicate cue type and reason): at sink Upper Body Bathing: Modified independent;Sitting   Lower Body Bathing: With caregiver independent assisting;With adaptive equipment;Moderate assistance;Sitting/lateral leans Lower Body Bathing Details (indicate cue type and reason): educated on long handle sponge - Pt states that she has hand held shower head Upper Body Dressing : Set up   Lower Body Dressing: Moderate assistance;With caregiver independent assisting;Sit to/from stand Lower Body Dressing Details (indicate cue type and reason): NT was assisting with donning socks - Pt able to bring feet cross to knees long sitting in bed Toilet Transfer: Supervision/safety;Ambulation;RW Toilet Transfer Details (indicate cue type and reason): BSC over regular height toilet Toileting- Clothing Manipulation and Hygiene: Supervision/safety;Sit to/from stand   Tub/ Shower Transfer: Tub transfer;Supervision/safety;Tub bench Tub/Shower Transfer Details (indicate cue type and reason): talked about importance of having someone there for safety initially Functional mobility during ADLs: Supervision/safety;Rolling walker       Vision Patient Visual Report: No change from baseline       Perception     Praxis      Pertinent Vitals/Pain Pain Assessment: 0-10 Pain Score: 3  Pain Location: BLE at incision points Pain Descriptors / Indicators: Discomfort;Sore Pain Intervention(s): Monitored during session;Repositioned;Patient requesting pain meds-RN notified     Hand Dominance  Extremity/Trunk Assessment Upper Extremity Assessment Upper Extremity Assessment: Overall WFL for tasks assessed   Lower Extremity Assessment Lower Extremity Assessment: Generalized weakness   Cervical / Trunk Assessment Cervical / Trunk Assessment: Normal    Communication Communication Communication: No difficulties   Cognition Arousal/Alertness: Awake/alert Behavior During Therapy: WFL for tasks assessed/performed Overall Cognitive Status: Within Functional Limits for tasks assessed                                     General Comments       Exercises     Shoulder Instructions      Home Living Family/patient expects to be discharged to:: Private residence Living Arrangements: Children;Non-relatives/Friends Available Help at Discharge: Family;Friend(s) Type of Home: House Home Access: Stairs to enter Technical brewer of Steps: 3 Entrance Stairs-Rails: None Home Layout: One level     Bathroom Shower/Tub: Teacher, early years/pre: Standard     Home Equipment: Tub bench;Hand held shower head          Prior Functioning/Environment Level of Independence: Independent                 OT Problem List: Decreased activity tolerance;Decreased knowledge of use of DME or AE;Pain      OT Treatment/Interventions:      OT Goals(Current goals can be found in the care plan section) Acute Rehab OT Goals Patient Stated Goal: to go home  OT Goal Formulation: With patient Time For Goal Achievement: 10/27/17 Potential to Achieve Goals: Good  OT Frequency:     Barriers to D/C:            Co-evaluation              AM-PAC PT "6 Clicks" Daily Activity     Outcome Measure Help from another person eating meals?: None Help from another person taking care of personal grooming?: None Help from another person toileting, which includes using toliet, bedpan, or urinal?: A Little Help from another person bathing (including washing, rinsing, drying)?: A Little Help from another person to put on and taking off regular upper body clothing?: None Help from another person to put on and taking off regular lower body clothing?: A Little 6 Click Score: 21   End of Session Equipment Utilized During  Treatment: Gait belt;Rolling walker Nurse Communication: Mobility status(in room providing meds to Pt)  Activity Tolerance: Patient tolerated treatment well Patient left: in bed;with call bell/phone within reach;with nursing/sitter in room  OT Visit Diagnosis: Unsteadiness on feet (R26.81);Other abnormalities of gait and mobility (R26.89);Pain Pain - part of body: Leg                Time: 0951-1010 OT Time Calculation (min): 19 min Charges:  OT General Charges $OT Visit: 1 Visit OT Evaluation $OT Eval Moderate Complexity: 1 Mod G-Codes:     Hulda Humphrey OTR/L [place] 10/14/2017, 10:22 AM

## 2017-10-14 NOTE — Care Management Note (Signed)
Case Management Note Marvetta Gibbons RN, BSN Unit 4E-Case Manager [phone]  Patient Details  Name: Kristy Baxter MRN: [id number] Date of Birth: Dec 28, 1950  Subjective/Objective:  Pt admitted s/p left fem-fem bypass                 Action/Plan: PTA pt lived at home- received notification by Jonelle Sidle with [facility] that pt has pre-op referral for [facility] needs on discharge. CM to follow for transition of care needs.   Expected Discharge Date:                  Expected Discharge Plan:  [place]  In-House Referral:  NA  Discharge planning Services  CM Consult  Post Acute Care Choice:  Home Health Choice offered to:     DME Arranged:    DME Agency:     HH Arranged:    HH Agency:  [facility]  Status of Service:  In process, will continue to follow  If discussed at Long Length of Stay Meetings, dates discussed:    Discharge Disposition: home/home health   Additional Comments:  Dawayne Patricia, RN 10/14/2017, 10:54 AM

## 2017-10-14 NOTE — Progress Notes (Signed)
Physical Therapy Treatment Patient Details Name: Kristy Baxter MRN: [id number] DOB: 04/05/1951 Today's Date: 10/14/2017    History of Present Illness 67yo female with R LE edema and L LE pain with gait. She received femoral to femoral bypass graft on 10/13/17. PMH HTN     PT Comments    Pt performed gait and stair training in prep for d/c home.  Pt continues to benefit from skilled rehab in home setting at d/c with addition of RW and 3:1 commode.  Plan next session for continued training with functional mobility.  Will continue PT per POC pending d/c.      Follow Up Recommendations  Home health PT     Equipment Recommendations  Rolling walker with 5" wheels;3in1 (PT)    Recommendations for Other Services       Precautions / Restrictions Precautions Precautions: Fall Restrictions Weight Bearing Restrictions: No    Mobility  Bed Mobility Overal bed mobility: Needs Assistance Bed Mobility: Supine to Sit;Sit to Supine     Supine to sit: Supervision Sit to supine: Supervision   General bed mobility comments: increased time and effort   Transfers Overall transfer level: Needs assistance Equipment used: Rolling walker (2 wheeled) Transfers: Sit to/from Stand Sit to Stand: Supervision         General transfer comment: Cues for safety with increased time.    Ambulation/Gait Ambulation/Gait assistance: Min guard Ambulation Distance (Feet): 120 Feet Assistive device: Rolling walker (2 wheeled) Gait Pattern/deviations: Step-through pattern;Trunk flexed   Gait velocity interpretation: Below normal speed for age/gender General Gait Details: Cues for forward gaze and RW safety.  Pt slow and guarded but stable with device.  pt took a few steps without device and she reaches for external support with unsteady presentation.     Stairs Stairs: Yes   Stair Management: No rails;Step to pattern;Forwards(Simulated HHA as patient does not have rails.  ) Number of Stairs:  2 General stair comments: min assistance for negotiation and safety with stair training.  Cues for sequencing and placement of RW at top of two stairs to improve safety with transition post stair negotiation.    Wheelchair Mobility    Modified Rankin (Stroke Patients Only)       Balance Overall balance assessment: No apparent balance deficits (not formally assessed);Needs assistance   Sitting balance-Leahy Scale: Good       Standing balance-Leahy Scale: Poor Standing balance comment: with device LOB noted.  With RW patient has no assistance needed.                              Cognition Arousal/Alertness: Awake/alert Behavior During Therapy: WFL for tasks assessed/performed Overall Cognitive Status: Within Functional Limits for tasks assessed                                        Exercises      General Comments        Pertinent Vitals/Pain Pain Assessment: 0-10 Pain Score: 3  Pain Location: BLE at incision points Pain Descriptors / Indicators: Discomfort;Sore Pain Intervention(s): Monitored during session;Repositioned    Home Living Family/patient expects to be discharged to:: Private residence Living Arrangements: Children;Non-relatives/Friends Available Help at Discharge: Family;Friend(s) Type of Home: House Home Access: Stairs to enter Entrance Stairs-Rails: None Home Layout: One level Home Equipment: Tub bench;Hand held shower head  Prior Function Level of Independence: Independent          PT Goals (current goals can now be found in the care plan section) Acute Rehab PT Goals Patient Stated Goal: to go home  Potential to Achieve Goals: Good Progress towards PT goals: Progressing toward goals    Frequency    Min 3X/week      PT Plan Current plan remains appropriate    Co-evaluation              AM-PAC PT "6 Clicks" Daily Activity  Outcome Measure  Difficulty turning over in bed (including adjusting  bedclothes, sheets and blankets)?: None Difficulty moving from lying on back to sitting on the side of the bed? : None Difficulty sitting down on and standing up from a chair with arms (e.g., wheelchair, bedside commode, etc,.)?: None Help needed moving to and from a bed to chair (including a wheelchair)?: A Little Help needed walking in hospital room?: A Little Help needed climbing 3-5 steps with a railing? : A Little 6 Click Score: 21    End of Session Equipment Utilized During Treatment: Gait belt Activity Tolerance: Patient tolerated treatment well Patient left: in chair;with call bell/phone within reach Nurse Communication: Mobility status PT Visit Diagnosis: Muscle weakness (generalized) (M62.81);Difficulty in walking, not elsewhere classified (R26.2)     Time: 5681-2751 PT Time Calculation (min) (ACUTE ONLY): 23 min  Charges:  $Gait Training: 8-22 mins $Therapeutic Activity: 8-22 mins                    G Codes:       Governor Rooks, PTA pager [phone]    Cristela Blue 10/14/2017, 11:29 AM

## 2017-10-15 NOTE — Progress Notes (Addendum)
  Progress Note    10/15/2017 7:29 AM 2 Days Post-Op  Subjective:  Still having pain at bilateral groin incision sites   Vitals:   10/14/17 2332 10/15/17 0319  BP: 113/81 140/78  Pulse: 79 83  Resp:  17  Temp: 98.3 F (36.8 C) 98.8 F (37.1 C)  SpO2:  95%   Physical Exam: Cardiac:  RRR Lungs:  Non labored Incisions:  Incisions are clean, dry, intact; no palpable hematoma Extremities:  Palpable DP pulses bilaterally Abdomen:  Soft Neurologic: A&O  CBC    Component Value Date/Time   WBC 10.9 (H) 10/14/2017 0344   RBC 3.58 (L) 10/14/2017 0344   HGB 10.3 (L) 10/14/2017 0344   HCT 32.8 (L) 10/14/2017 0344   PLT 280 10/14/2017 0344   MCV 91.6 10/14/2017 0344   MCH 28.8 10/14/2017 0344   MCHC 31.4 10/14/2017 0344   RDW 14.2 10/14/2017 0344   LYMPHSABS 1.6 05/16/2016 2336   MONOABS 0.8 05/16/2016 2336   EOSABS 0.0 05/16/2016 2336   BASOSABS 0.0 05/16/2016 2336    BMET    Component Value Date/Time   NA 140 10/14/2017 0344   K 5.1 10/14/2017 0344   CL 105 10/14/2017 0344   CO2 24 10/14/2017 0344   GLUCOSE 119 (H) 10/14/2017 0344   BUN 16 10/14/2017 0344   CREATININE 1.34 (H) 10/14/2017 0344   CALCIUM 8.8 (L) 10/14/2017 0344   GFRNONAA 39 (L) 10/14/2017 0344   GFRAA 46 (L) 10/14/2017 0344    INR    Component Value Date/Time   INR 0.98 10/11/2017 1015     Intake/Output Summary (Last 24 hours) at 10/15/2017 0729 Last data filed at 10/14/2017 1700 Gross per 24 hour  Intake 480 ml  Output 2 ml  Net 478 ml     Assessment/Plan:  67 y.o. female is s/p R to L fem fem bypass with Dacron graft 2 Days Post-Op   Fem fem bypass patent with palpable pedal pulses bilaterally Continue aspirin and statin Encouraged increase in mobility D/c when pain better controlled and mobility improved  DVT prophylaxis:  subq heparin   Dagoberto Ligas, PA-C [facility] [phone] 10/15/2017 7:29 AM  I have interviewed the patient and examined the  patient. I agree with the findings by the PA. Palpable dorsalis pedis pulses. Incisions look good.  Gae Gallop, MD [phone]

## 2017-10-16 ENCOUNTER — Encounter (HOSPITAL_COMMUNITY): Payer: Medicare HMO

## 2017-10-16 NOTE — Progress Notes (Addendum)
  Progress Note    10/16/2017 7:41 AM 3 Days Post-Op  Subjective:  Pain to groin incisions improving   Vitals:   10/16/17 0009 10/16/17 0410  BP: 114/77 126/82  Pulse: 70 83  Resp: 19 19  Temp: 97.9 F (36.6 C) 97.8 F (36.6 C)  SpO2: 97% 95%   Physical Exam: Cardiac:  RRR Lungs:  Non labored Incisions:  B groin incisions soft without hematoma or drainage Extremities:  Palpable DP pulses Abdomen:  Soft Neurologic: A&O  CBC    Component Value Date/Time   WBC 10.9 (H) 10/14/2017 0344   RBC 3.58 (L) 10/14/2017 0344   HGB 10.3 (L) 10/14/2017 0344   HCT 32.8 (L) 10/14/2017 0344   PLT 280 10/14/2017 0344   MCV 91.6 10/14/2017 0344   MCH 28.8 10/14/2017 0344   MCHC 31.4 10/14/2017 0344   RDW 14.2 10/14/2017 0344   LYMPHSABS 1.6 05/16/2016 2336   MONOABS 0.8 05/16/2016 2336   EOSABS 0.0 05/16/2016 2336   BASOSABS 0.0 05/16/2016 2336    BMET    Component Value Date/Time   NA 140 10/14/2017 0344   K 5.1 10/14/2017 0344   CL 105 10/14/2017 0344   CO2 24 10/14/2017 0344   GLUCOSE 119 (H) 10/14/2017 0344   BUN 16 10/14/2017 0344   CREATININE 1.34 (H) 10/14/2017 0344   CALCIUM 8.8 (L) 10/14/2017 0344   GFRNONAA 39 (L) 10/14/2017 0344   GFRAA 46 (L) 10/14/2017 0344    INR    Component Value Date/Time   INR 0.98 10/11/2017 1015     Intake/Output Summary (Last 24 hours) at 10/16/2017 0741 Last data filed at 10/15/2017 2000 Gross per 24 hour  Intake 960 ml  Output 1 ml  Net 959 ml     Assessment/Plan:  67 y.o. female is s/p R to L fem fem bypass with Dacron graft  3 Days Post-Op   Palpable DP pulses bilaterally Encouraged OOB and ambulation Patient should have dry gauze over bilateral groin incisions to prevent incision breakdown Probable discharge home with home PT tomorrow  DVT prophylaxis:  subq heparin   Dagoberto Ligas, PA-C [facility] [phone] 10/16/2017 7:41 AM  I have interviewed the patient and examined the  patient. I agree with the findings by the PA. Both incisions look fine. Palpable pedal pulses. Anticipate discharge tomorrow.   Gae Gallop, MD [phone]

## 2017-10-17 ENCOUNTER — Encounter (HOSPITAL_COMMUNITY): Payer: Medicare HMO

## 2017-10-17 ENCOUNTER — Telehealth: Payer: Self-pay | Admitting: *Deleted

## 2017-10-17 MED ORDER — OXYCODONE-ACETAMINOPHEN 5-325 MG PO TABS: 1.0000 | ORAL_TABLET | Freq: Four times a day (QID) | ORAL | 0 refills | Status: DC | PRN

## 2017-10-17 NOTE — Discharge Instructions (Signed)
 [facility] [place]  Discharge instructions  Lower Extremity Bypass Surgery  Please refer to the following instruction for your post-procedure care. Your surgeon or physician assistant will discuss any changes with you.  Activity  You are encouraged to walk as much as you can. You can slowly return to normal activities during the month after your surgery. Avoid strenuous activity and heavy lifting until your doctor tells you it's OK. Avoid activities such as vacuuming or swinging a golf club. Do not drive until your doctor give the OK and you are no longer taking prescription pain medications. It is also normal to have difficulty with sleep habits, eating and bowel movement after surgery. These will go away with time.  Bathing/Showering  You may shower after you go home. Do not soak in a bathtub, hot tub, or swim until the incision heals completely.  Incision Care  Clean your incision with mild soap and water. Shower every day. Pat the area dry with a clean towel. You do not need a bandage unless otherwise instructed. Do not apply any ointments or creams to your incision. If you have open wounds you will be instructed how to care for them or a visiting nurse may be arranged for you. If you have staples or sutures along your incision they will be removed at your post-op appointment. You may have skin glue on your incision. Do not peel it off. It will come off on its own in about one week. If you have a great deal of moisture in your groin, use a gauze help keep this area dry.  Diet  Resume your normal diet. There are no special food restrictions following this procedure. A low fat/ low cholesterol diet is recommended for all patients with vascular disease. In order to heal from your surgery, it is CRITICAL to get adequate nutrition. Your body requires vitamins, minerals, and protein. Vegetables are the best source of vitamins and minerals. Vegetables also provide the  perfect balance of protein. Processed food has little nutritional value, so try to avoid this.  Medications  Resume taking all your medications unless your doctor or nurse practitioner tells you not to. If your incision is causing pain, you may take over-the-counter pain relievers such as acetaminophen (Tylenol). If you were prescribed a stronger pain medication, please aware these medication can cause nausea and constipation. Prevent nausea by taking the medication with a snack or meal. Avoid constipation by drinking plenty of fluids and eating foods with high amount of fiber, such as fruits, vegetables, and grains. Take Colase 100 mg (an over-the-counter stool softener) twice a day as needed for constipation. Do not take Tylenol if you are taking prescription pain medications.  Follow Up  Our office will schedule a follow up appointment 2-3 weeks following discharge.  Please call us immediately for any of the following conditions  Severe or worsening pain in your legs or feet while at rest or while walking Increase pain, redness, warmth, or drainage (pus) from your incision site(s) Fever of 101 degree or higher The swelling in your leg with the bypass suddenly worsens and becomes more painful than when you were in the hospital If you have been instructed to feel your graft pulse then you should do so every day. If you can no longer feel this pulse, call the office immediately. Not all patients are given this instruction.  Leg swelling is common after leg bypass surgery.  The swelling should improve over a few months   following surgery. To improve the swelling, you may elevate your legs above the level of your heart while you are sitting or resting. Your surgeon or physician assistant may ask you to apply an ACE wrap or wear compression (TED) stockings to help to reduce swelling.  Reduce your risk of vascular disease  Stop smoking. If you would like help call [facility] at 1-800-QUIT-NOW  (1-800-784-8669) or [place] at 336-586-4000.  Manage your cholesterol Maintain a desired weight Control your diabetes weight Control your diabetes Keep your blood pressure down  If you have any questions, please call the office at [phone]   

## 2017-10-17 NOTE — Progress Notes (Addendum)
  Progress Note    10/17/2017 7:22 AM 4 Days Post-Op  Subjective:  Feeling fit for discharge today   Vitals:   10/16/17 2356 10/17/17 0348  BP: 94/72 128/86  Pulse: 67 83  Resp: 20 17  Temp: 98 F (36.7 C) 98.3 F (36.8 C)  SpO2: 99% 98%   Physical Exam: Lungs:  Non labored Incisions:  B groin incisions stable Extremities:  Palpable and symmetrical DP pulses Abdomen:  Soft Neurologic: A&O  CBC    Component Value Date/Time   WBC 10.9 (H) 10/14/2017 0344   RBC 3.58 (L) 10/14/2017 0344   HGB 10.3 (L) 10/14/2017 0344   HCT 32.8 (L) 10/14/2017 0344   PLT 280 10/14/2017 0344   MCV 91.6 10/14/2017 0344   MCH 28.8 10/14/2017 0344   MCHC 31.4 10/14/2017 0344   RDW 14.2 10/14/2017 0344   LYMPHSABS 1.6 05/16/2016 2336   MONOABS 0.8 05/16/2016 2336   EOSABS 0.0 05/16/2016 2336   BASOSABS 0.0 05/16/2016 2336    BMET    Component Value Date/Time   NA 140 10/14/2017 0344   K 5.1 10/14/2017 0344   CL 105 10/14/2017 0344   CO2 24 10/14/2017 0344   GLUCOSE 119 (H) 10/14/2017 0344   BUN 16 10/14/2017 0344   CREATININE 1.34 (H) 10/14/2017 0344   CALCIUM 8.8 (L) 10/14/2017 0344   GFRNONAA 39 (L) 10/14/2017 0344   GFRAA 46 (L) 10/14/2017 0344    INR    Component Value Date/Time   INR 0.98 10/11/2017 1015     Intake/Output Summary (Last 24 hours) at 10/17/2017 0175 Last data filed at 10/16/2017 2015 Gross per 24 hour  Intake 560 ml  Output -  Net 560 ml     Assessment/Plan:  67 y.o. female is s/p R to L fem fem bypass with Dacron graft  4 Days Post-Op   Palpable DP pulses bilaterally Discharge home today with home PT Follow up in 2 weeks with Dr. Rolanda Lundborg, PA-C [facility] [phone] 10/17/2017 7:22 AM  I have independently interviewed and examined patient and agree with PA assessment and plan above.   Bethenny Losee C. Donzetta Matters, MD [facility] [place] Office: [phone] Pager: [phone]

## 2017-10-17 NOTE — Care Management Note (Signed)
Case Management Note Marvetta Gibbons RN, BSN Unit 4E-Case Manager [phone]  Patient Details  Name: Kristy Baxter MRN: [id number] Date of Birth: Oct 14, 1950  Subjective/Objective:  Pt admitted s/p left fem-fem bypass                 Action/Plan: PTA pt lived at home- received notification by Jonelle Sidle with [facility] that pt has pre-op referral for [facility] needs on discharge. CM to follow for transition of care needs.   Expected Discharge Date:  10/17/17               Expected Discharge Plan:  [place]  In-House Referral:  NA  Discharge planning Services  CM Consult  Post Acute Care Choice:  Home Health, Durable Medical Equipment Choice offered to:  Patient  DME Arranged:  3-N-1, Walker rolling DME Agency:  [place]:  PT [facility] Agency:  [facility]  Status of Service:  Completed, signed off  If discussed at [place] of Stay Meetings, dates discussed:    Discharge Disposition: home/home health   Additional Comments:  10/17/17- 1000- Cherrish Vitali RN, CM- pt for d/c home today- orders placed for DME and HH needs- notified Butch Penny with [facility] for RW and 3n1 needs- DME to be delivered to room prior to discharge. Tiffany with [facility] notified of pt's discharge for [facility] needs.   Dawayne Patricia, RN 10/17/2017, 10:03 AM

## 2017-10-17 NOTE — Progress Notes (Signed)
Discharge instructions and Rx given.  All questions answered.

## 2017-10-17 NOTE — Telephone Encounter (Signed)
Pt was on TCM report admitted 10/13/17 for right lower extremity swelling and left lower extremity pain when she walks. 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 She had a procedure done femoral to femoral bypass or originating from the right side to the left side. Pt D/c 10/17/17, and will follow-up w/specialist in 2 weeks.Marland KitchenJohny Chess

## 2017-10-18 NOTE — Discharge Summary (Signed)
Physician Discharge Summary   Patient ID: Kristy Baxter [id number] 67 y.o. 04-09-1951  Admit date: 10/13/2017  Discharge date and time: 10/17/2017 10:45 AM   Admitting Physician: Waynetta Sandy, MD   Discharge Physician: same  Admission Diagnoses: aortoiliac occulsive disease  Discharge Diagnoses: same  Admission Condition: fair  Discharged Condition: good  Indication for Admission: left lower extremity claudication  Hospital Course: [name] is a 67 year old female who came in as an outpatient for right to left femoral to femoral bypass with background by Dr. Donzetta Matters on 10/13/17 due to left leg claudication.  She tolerated this procedure well and was admitted to the hospital postoperatively.  POD #1 patient had bilateral palpable dorsalis pedis pulses.  Postoperative labs do not demonstrate any significant abnormalities.  The remainder of hospital stay included increasing activity as well as pain management.  She was evaluated by physical therapy and Occupational Therapy who recommended home health PT and rolling walker respectively.  At the conclusion of patient's hospital stay she was ambulating without difficulty with walker, tolerating a home diet, and feeling fit for discharge home without significant pain.  Case management arranged home health PT prior to discharge.  Patient was prescribed 2-3 days of narcotic pain medication for continued postoperative pain control.  She will follow-up in office in about 2 weeks.  Discharge instructions were reviewed with the patient and she voiced her understanding.  She will be discharged in stable condition.  Consults: None  Treatments: surgery: Right to left femoral to femoral bypass graft with Dacron by Dr. Donzetta Matters 10/13/17  Discharge Exam: See progress note 10/17/17 Vitals:   10/16/17 2356 10/17/17 0348  BP: 94/72 128/86  Pulse: 67 83  Resp: 20 17  Temp: 98 F (36.7 C) 98.3 F (36.8 C)  SpO2: 99% 98%     Disposition:  06-Home-Health Care Svc  Patient Instructions:  Allergies as of 10/17/2017      Reactions   Sulfonamide Derivatives Swelling   Swollen eyes       Medication List    TAKE these medications   acetaminophen 500 MG tablet Commonly known as:  TYLENOL Take 500 mg by mouth every 6 (six) hours as needed for mild pain or headache.   amLODipine 10 MG tablet Commonly known as:  NORVASC Take 10 mg by mouth daily.   aspirin EC 81 MG tablet Take 81 mg by mouth daily.   atorvastatin 80 MG tablet Commonly known as:  LIPITOR Take 80 mg by mouth daily.   Calcium-Vitamin D3 600-400 MG-UNIT Tabs Take 1 tablet by mouth daily.   ibuprofen 200 MG tablet Commonly known as:  ADVIL,MOTRIN Take 400-600 mg by mouth every 6 (six) hours as needed for headache.   metoprolol succinate 25 MG 24 hr tablet Commonly known as:  TOPROL-XL Take 25 mg by mouth daily.   oxyCODONE-acetaminophen 5-325 MG tablet Commonly known as:  PERCOCET/ROXICET Take 1 tablet by mouth every 6 (six) hours as needed for moderate pain.   pantoprazole 40 MG tablet Commonly known as:  PROTONIX Take 80 mg by mouth daily.      Activity: activity as tolerated Diet: regular diet Wound Care: keep wound clean and dry  Follow-up with Dr. Donzetta Matters in 2 weeks.  SignedDagoberto Ligas 10/18/2017 7:55 AM

## 2017-10-24 ENCOUNTER — Telehealth: Payer: Self-pay

## 2017-10-24 NOTE — Telephone Encounter (Signed)
Called pt, no answer, LVM regarding STD paperwork received from [employer]. Stated that pt will need to pay $29 fee to complete and fax.

## 2017-11-02 ENCOUNTER — Other Ambulatory Visit: Payer: Self-pay | Admitting: *Deleted

## 2017-11-02 DIAGNOSIS — G8918 Other acute postprocedural pain: Secondary | ICD-10-CM

## 2017-11-02 MED ORDER — OXYCODONE-ACETAMINOPHEN 5-325 MG PO TABS: 1.0000 | ORAL_TABLET | Freq: Four times a day (QID) | ORAL | 0 refills | Status: AC | PRN

## 2017-11-04 ENCOUNTER — Ambulatory Visit (INDEPENDENT_AMBULATORY_CARE_PROVIDER_SITE_OTHER): Payer: Medicare HMO | Admitting: Vascular Surgery

## 2017-11-04 ENCOUNTER — Encounter: Payer: Self-pay | Admitting: Vascular Surgery

## 2017-11-04 VITALS — BP 116/81 | HR 55 | Temp 98.2°F | Resp 17 | Ht 60.0 in | Wt 188.5 lb

## 2017-11-04 DIAGNOSIS — I739 Peripheral vascular disease, unspecified: Secondary | ICD-10-CM

## 2017-11-04 NOTE — Progress Notes (Addendum)
    Postoperative Visit   History of Present Illness   Kristy Baxter is a 67 y.o. year old female who presents for postoperative follow-up for: Right to left femoral to femoral bypass graft with 54mm dacron (10/13/17).  The patient's groin incisions are well healed.  The patient notes  resolution of left lower extremity claudication symptoms.  The patient is able to complete their activities of daily living.  She completed home physical therapy regimen however still ambulates with her walker.  She is taking an aspirin and a statin daily.  She states she has quit smoking.  For VQI Use Only   PRE-ADM LIVING: Home  AMB STATUS: Ambulatory   Physical Examination   Vitals:   11/04/17 1334  BP: 116/81  Pulse: (!) 55  Resp: 17  Temp: 98.2 F (36.8 C)  TempSrc: Oral  SpO2: 96%  Weight: 188 lb 8 oz (85.5 kg)  Height: 5' (1.524 m)    BLE: Incisions are well healed, palpable DP pulses bilaterally   Medical Decision Making   Kristy Baxter is a 67 y.o. year old female who presents s/p R to L femoral to femoral bypass due to L EIA occlusion.   The patient's bypass incisions are healing appropriately with resolution of pre-operative LLE claudication symptoms. Continue aspirin and statin regimen Encouraged continued ambulation Encouraged continued smoking cessation She will follow up with her Cardiologist Dr. Einar Gip for surveillance of PAD   Dagoberto Ligas, PA-C [facility] Office: [phone]  I have independently interviewed patient and agree with PA assessment and plan above. Will f/u with Dr. Einar Gip and I will see prn.   Brandon C. Donzetta Matters, MD [facility] Office: [phone] Pager: [phone]

## 2018-01-25 ENCOUNTER — Encounter: Payer: Self-pay | Admitting: Gastroenterology

## 2018-02-01 ENCOUNTER — Other Ambulatory Visit: Payer: Self-pay | Admitting: Internal Medicine

## 2018-02-01 DIAGNOSIS — Z1231 Encounter for screening mammogram for malignant neoplasm of breast: Secondary | ICD-10-CM

## 2018-02-16 ENCOUNTER — Other Ambulatory Visit: Payer: Self-pay | Admitting: Internal Medicine

## 2018-02-16 DIAGNOSIS — E2839 Other primary ovarian failure: Secondary | ICD-10-CM

## 2018-02-23 ENCOUNTER — Ambulatory Visit
Admission: RE | Admit: 2018-02-23 | Discharge: 2018-02-23 | Disposition: A | Discharge status: Home / Self Care | Payer: Medicare HMO | Source: Ambulatory Visit | Attending: Internal Medicine | Admitting: Internal Medicine

## 2018-02-23 DIAGNOSIS — Z1231 Encounter for screening mammogram for malignant neoplasm of breast: Secondary | ICD-10-CM

## 2018-03-30 ENCOUNTER — Ambulatory Visit
Admission: RE | Admit: 2018-03-30 | Discharge: 2018-03-30 | Disposition: A | Discharge status: Home / Self Care | Payer: Medicare HMO | Source: Ambulatory Visit | Attending: Internal Medicine | Admitting: Internal Medicine

## 2018-03-30 DIAGNOSIS — E2839 Other primary ovarian failure: Secondary | ICD-10-CM

## 2018-04-05 ENCOUNTER — Ambulatory Visit (AMBULATORY_SURGERY_CENTER): Payer: Self-pay | Admitting: *Deleted

## 2018-04-05 VITALS — Ht 60.0 in | Wt 197.0 lb

## 2018-04-05 DIAGNOSIS — Z86010 Personal history of colonic polyps: Secondary | ICD-10-CM

## 2018-04-05 MED ORDER — NA SULFATE-K SULFATE-MG SULF 17.5-3.13-1.6 GM/177ML PO SOLN: 1.0000 | Freq: Once | ORAL | 0 refills | Status: AC

## 2018-04-05 NOTE — Progress Notes (Signed)
No egg or soy allergy known to patient  No issues with past sedation with any surgeries  or procedures, no intubation problems  No diet pills per patient No home 02 use per patient  No blood thinners per patient  Pt denies issues with constipation  No A fib or A flutter  EMMI video sent to pt's e mail  - pt has no e mail   

## 2018-04-10 ENCOUNTER — Encounter: Payer: Self-pay | Admitting: Gastroenterology

## 2018-04-19 ENCOUNTER — Ambulatory Visit (AMBULATORY_SURGERY_CENTER): Payer: Medicare HMO | Admitting: Gastroenterology

## 2018-04-19 ENCOUNTER — Encounter: Payer: Self-pay | Admitting: Gastroenterology

## 2018-04-19 VITALS — BP 126/77 | HR 72 | Temp 96.9°F | Resp 26 | Ht 60.0 in | Wt 197.0 lb

## 2018-04-19 DIAGNOSIS — D129 Benign neoplasm of anus and anal canal: Secondary | ICD-10-CM

## 2018-04-19 DIAGNOSIS — D128 Benign neoplasm of rectum: Secondary | ICD-10-CM

## 2018-04-19 DIAGNOSIS — Z86010 Personal history of colonic polyps: Secondary | ICD-10-CM

## 2018-04-19 DIAGNOSIS — K621 Rectal polyp: Secondary | ICD-10-CM

## 2018-04-19 DIAGNOSIS — D120 Benign neoplasm of cecum: Secondary | ICD-10-CM | POA: Diagnosis not present

## 2018-04-19 MED ORDER — SODIUM CHLORIDE 0.9 % IV SOLN: 500.0000 mL | Freq: Once | INTRAVENOUS | Status: DC

## 2018-04-19 NOTE — Progress Notes (Signed)
I have reviewed the patient's medical history in detail and updated the computerized patient record.

## 2018-04-19 NOTE — Progress Notes (Signed)
Report to PACU, RN, vss, BBS= Clear.  

## 2018-04-19 NOTE — Progress Notes (Signed)
Called to room to assist during endoscopic procedure.  Patient ID and intended procedure confirmed with present staff. Received instructions for my participation in the procedure from the performing physician.  

## 2018-04-19 NOTE — Patient Instructions (Signed)
**   Handouts given on polyps and diverticulosis **   YOU HAD AN ENDOSCOPIC PROCEDURE TODAY AT THE [facility]:   Refer to the procedure report that was given to you for any specific questions about what was found during the examination.  If the procedure report does not answer your questions, please call your gastroenterologist to clarify.  If you requested that your care partner not be given the details of your procedure findings, then the procedure report has been included in a sealed envelope for you to review at your convenience later.  YOU SHOULD EXPECT: Some feelings of bloating in the abdomen. Passage of more gas than usual.  Walking can help get rid of the air that was put into your GI tract during the procedure and reduce the bloating. If you had a lower endoscopy (such as a colonoscopy or flexible sigmoidoscopy) you may notice spotting of blood in your stool or on the toilet paper. If you underwent a bowel prep for your procedure, you may not have a normal bowel movement for a few days.  Please Note:  You might notice some irritation and congestion in your nose or some drainage.  This is from the oxygen used during your procedure.  There is no need for concern and it should clear up in a day or so.  SYMPTOMS TO REPORT IMMEDIATELY:   Following lower endoscopy (colonoscopy or flexible sigmoidoscopy):  Excessive amounts of blood in the stool  Significant tenderness or worsening of abdominal pains  Swelling of the abdomen that is new, acute  Fever of 100F or higher  For urgent or emergent issues, a gastroenterologist can be reached at any hour by calling [phone].   DIET:  We do recommend a small meal at first, but then you may proceed to your regular diet.  Drink plenty of fluids but you should avoid alcoholic beverages for 24 hours.  ACTIVITY:  You should plan to take it easy for the rest of today and you should NOT DRIVE or use heavy machinery until tomorrow (because  of the sedation medicines used during the test).    FOLLOW UP: Our staff will call the number listed on your records the next business day following your procedure to check on you and address any questions or concerns that you may have regarding the information given to you following your procedure. If we do not reach you, we will leave a message.  However, if you are feeling well and you are not experiencing any problems, there is no need to return our call.  We will assume that you have returned to your regular daily activities without incident.  If any biopsies were taken you will be contacted by phone or by letter within the next 1-3 weeks.  Please call us at [phone] if you have not heard about the biopsies in 3 weeks.    SIGNATURES/CONFIDENTIALITY: You and/or your care partner have signed paperwork which will be entered into your electronic medical record.  These signatures attest to the fact that that the information above on your After Visit Summary has been reviewed and is understood.  Full responsibility of the confidentiality of this discharge information lies with you and/or your care-partner. 

## 2018-04-19 NOTE — Op Note (Signed)
[facility] Patient Name: Kristy Baxter Procedure Date: 04/19/2018 11:45 AM MRN: [id number] Endoscopist: Ladene Artist , MD Age: 67 Referring MD:  Date of Birth: Jan 08, 1951 Gender: Female Account #: [id number] Procedure:                Colonoscopy Indications:              Surveillance: Personal history of adenomatous                            polyps on last colonoscopy > 5 years ago Medicines:                Monitored Anesthesia Care Procedure:                Pre-Anesthesia Assessment:                           - Prior to the procedure, a History and Physical                            was performed, and patient medications and                            allergies were reviewed. The patient's tolerance of                            previous anesthesia was also reviewed. The risks                            and benefits of the procedure and the sedation                            options and risks were discussed with the patient.                            All questions were answered, and informed consent                            was obtained. Prior Anticoagulants: The patient has                            taken no previous anticoagulant or antiplatelet                            agents. ASA Grade Assessment: III - A patient with                            severe systemic disease. After reviewing the risks                            and benefits, the patient was deemed in                            satisfactory condition to undergo the procedure.  After obtaining informed consent, the colonoscope                            was passed under direct vision. Throughout the                            procedure, the patient's blood pressure, pulse, and                            oxygen saturations were monitored continuously. The                            Colonoscope was introduced through the anus and                            advanced to the the  cecum, identified by                            appendiceal orifice and ileocecal valve. The                            ileocecal valve, appendiceal orifice, and rectum                            were photographed. The quality of the bowel                            preparation was good. The colonoscopy was performed                            without difficulty. The patient tolerated the                            procedure well. Scope In: 11:50:43 AM Scope Out: 12:05:02 PM Scope Withdrawal Time: 0 hours 11 minutes 51 seconds  Total Procedure Duration: 0 hours 14 minutes 19 seconds  Findings:                 The perianal and digital rectal examinations were                            normal.                           Two sessile polyps were found in the rectum and                            cecum. The polyps were 4 to 5 mm in size. These                            polyps were removed with a cold biopsy forceps.                            Resection and retrieval were complete.  Multiple medium-mouthed diverticula were found in                            the left colon.                           The exam was otherwise without abnormality on                            direct and retroflexion views. Complications:            No immediate complications. Estimated blood loss:                            None. Estimated Blood Loss:     Estimated blood loss: none. Impression:               - Two 4 to 5 mm polyps in the rectum and in the                            cecum, removed with a cold biopsy forceps. Resected                            and retrieved.                           - Diverticulosis in the left colon.                           - The examination was otherwise normal on direct                            and retroflexion views. Recommendation:           - Repeat colonoscopy in 5 years for surveillance.                           - Patient has a contact  number available for                            emergencies. The signs and symptoms of potential                            delayed complications were discussed with the                            patient. Return to normal activities tomorrow.                            Written discharge instructions were provided to the                            patient.                           - High fiber diet.                           -  Continue present medications.                           - Await pathology results. Ladene Artist, MD 04/19/2018 12:08:00 PM This report has been signed electronically.

## 2018-04-20 ENCOUNTER — Telehealth: Payer: Self-pay | Admitting: *Deleted

## 2018-04-20 NOTE — Telephone Encounter (Signed)
  Follow up Call-  Call back number 04/19/2018  Post procedure Call Back phone  # [phone]  Permission to leave phone message No  Some recent data might be hidden     Patient questions:  Do you have a fever, pain , or abdominal swelling? No. Pain Score  0 *  Have you tolerated food without any problems? Yes.    Have you been able to return to your normal activities? Yes.    Do you have any questions about your discharge instructions: Diet   No. Medications  No. Follow up visit  No.  Do you have questions or concerns about your Care? No.  Actions: * If pain score is 4 or above: No action needed, pain <4.

## 2018-04-25 ENCOUNTER — Encounter: Payer: Self-pay | Admitting: Gastroenterology

## 2019-04-05 ENCOUNTER — Other Ambulatory Visit: Payer: Self-pay | Admitting: Internal Medicine

## 2019-04-05 DIAGNOSIS — Z1231 Encounter for screening mammogram for malignant neoplasm of breast: Secondary | ICD-10-CM

## 2019-05-21 ENCOUNTER — Other Ambulatory Visit: Payer: Self-pay

## 2019-05-21 ENCOUNTER — Ambulatory Visit
Admission: RE | Admit: 2019-05-21 | Discharge: 2019-05-21 | Disposition: A | Discharge status: Home / Self Care | Payer: Medicare Other | Source: Ambulatory Visit | Attending: Internal Medicine | Admitting: Internal Medicine

## 2019-05-21 DIAGNOSIS — Z1231 Encounter for screening mammogram for malignant neoplasm of breast: Secondary | ICD-10-CM

## 2019-10-28 ENCOUNTER — Ambulatory Visit: Payer: Medicare Other | Attending: Internal Medicine

## 2019-10-28 DIAGNOSIS — Z23 Encounter for immunization: Secondary | ICD-10-CM | POA: Insufficient documentation

## 2019-10-28 NOTE — Progress Notes (Signed)
   Covid-19 Vaccination Clinic  Name:  Kristy Baxter    MRN: [id number] DOB: [birth year]  10/28/2019  Ms. [name] was observed post Covid-19 immunization for 15 minutes without incidence. She was provided with Vaccine Information Sheet and instruction to access the V-Safe system.   Ms. [name] was instructed to call 911 with any severe reactions post vaccine: Marland Kitchen Difficulty breathing  . Swelling of your face and throat  . A fast heartbeat  . A bad rash all over your body  . Dizziness and weakness    Immunizations Administered    Name Date Dose VIS Date Route   Pfizer COVID-19 Vaccine 10/28/2019  8:37 AM 0.3 mL 08/10/2019 Intramuscular   Manufacturer: [place]   Lot: [id number]   [place]: [id number]

## 2019-11-27 ENCOUNTER — Ambulatory Visit: Payer: Medicare Other | Attending: Internal Medicine

## 2019-11-27 DIAGNOSIS — Z23 Encounter for immunization: Secondary | ICD-10-CM

## 2019-11-27 NOTE — Progress Notes (Signed)
   Covid-19 Vaccination Clinic  Name:  MALAYIA DEBARI    MRN: [id number] DOB: 1950/11/04  11/27/2019  Ms. Pierini was observed post Covid-19 immunization for 15 minutes without incident. She was provided with Vaccine Information Sheet and instruction to access the V-Safe system.   Ms. Sim was instructed to call 911 with any severe reactions post vaccine: Marland Kitchen Difficulty breathing  . Swelling of face and throat  . A fast heartbeat  . A bad rash all over body  . Dizziness and weakness   Immunizations Administered    Name Date Dose VIS Date Route   Pfizer COVID-19 Vaccine 11/27/2019  8:32 AM 0.3 mL 08/10/2019 Intramuscular   Manufacturer: [place]   Lot: [id number]   [place]: [id number]

## 2019-12-11 ENCOUNTER — Encounter: Payer: Self-pay | Admitting: Gastroenterology

## 2019-12-11 ENCOUNTER — Ambulatory Visit (INDEPENDENT_AMBULATORY_CARE_PROVIDER_SITE_OTHER): Payer: Medicare Other | Admitting: Gastroenterology

## 2019-12-11 VITALS — BP 102/60 | HR 71 | Temp 97.8°F | Ht 60.0 in | Wt 212.0 lb

## 2019-12-11 DIAGNOSIS — R195 Other fecal abnormalities: Secondary | ICD-10-CM | POA: Diagnosis not present

## 2019-12-11 MED ORDER — PANTOPRAZOLE SODIUM 40 MG PO TBEC: 40.0000 mg | DELAYED_RELEASE_TABLET | Freq: Two times a day (BID) | ORAL | 11 refills | Status: AC

## 2019-12-11 NOTE — Patient Instructions (Signed)
We have sent the following medications to your pharmacy for you to pick up at your convenience: pantoprazole 40 mg twice daily.   Patient advised to avoid spicy, acidic, citrus, chocolate, mints, fruit and fruit juices.  Limit the intake of caffeine, alcohol and Soda.  Don't exercise too soon after eating.  Don't lie down within 3-4 hours of eating.  Elevate the head of your bed.  Call if your pantoprazole is not helping.   Thank you for choosing me and [facility].  Pricilla Riffle. Dagoberto Ligas., MD., Marval Regal

## 2019-12-11 NOTE — Progress Notes (Signed)
    History of Present Illness: This is a 69 year old female referred by Novella Rob, NP for evaluation of positive FIT.  She relates occasional mild constipation.  She has ongoing difficulties with GERD.  She states she ran out of pantoprazole and her symptoms have been not as well controlled on OTC Prilosec.  No other gastrointestinal complaints. Patient underwent colonoscopy less than 2 years ago as outlined below.  EGD performed in 2010 showed a small hiatal hernia and an irregular Z-line.  Biopsies showed mild inflammation consistent with GERD and no evidence of intestinal metaplasia.  Denies weight loss, abdominal pain, diarrhea, change in stool caliber, melena, hematochezia, nausea, vomiting, dysphagia,  chest pain.  Colonoscopy 03/2018 - Two 4 to 5 mm polyps in the rectum and in the cecum, removed with a cold biopsy forceps. Resected and retrieved. One tublular adenoma, one hyperplastic. - Diverticulosis in the left colon. - The examination was otherwise normal on direct and retroflexion views.  Current Medications, Allergies, Past Medical History, Past Surgical History, Family History and Social History were reviewed in [employer] record.   Physical Exam: General: Well developed, well nourished, no acute distress Head: Normocephalic and atraumatic Eyes:  sclerae anicteric, EOMI Ears: Normal auditory acuity Mouth: Not examined, mask on during Covid-19 pandemic Lungs: Clear throughout to auscultation Heart: Regular rate and rhythm; no murmurs, rubs or bruits Abdomen: Soft, non tender and non distended. No masses, hepatosplenomegaly or hernias noted. Normal Bowel sounds Rectal: No lesions, no tenderness, heme negative brown stool Musculoskeletal: Symmetrical with no gross deformities  Pulses:  Normal pulses noted Extremities: No clubbing, cyanosis, edema or deformities noted Neurological: Alert oriented x 4, grossly nonfocal Psychological:  Alert and  cooperative. Normal mood and affect   Assessment and Recommendations:  1.  FIT positive.  Given complete colonoscopy to the cecum performed less than 3 years ago with a good bowel prep we will not repeat colonoscopy at this time.  If she develops additional symptoms colorectal symptoms will reassess.  2.  GERD.  Resume Protonix 40 mg po bid. closely follow antireflux measures. Pepcid ac bid prn.  If symptoms not adequately controlled consider a different PPI and EGD.  3.  Personal history of adenomatous colon polyps.  5-year interval surveillance colonoscopy is recommended in August 2024.  4.  Mild constipation.  Increase fiber and water intake.  MiraLAX daily as needed.

## 2020-04-16 ENCOUNTER — Other Ambulatory Visit: Payer: Self-pay | Admitting: General Practice

## 2020-04-16 DIAGNOSIS — Z1231 Encounter for screening mammogram for malignant neoplasm of breast: Secondary | ICD-10-CM

## 2020-05-21 ENCOUNTER — Other Ambulatory Visit: Payer: Self-pay

## 2020-05-21 ENCOUNTER — Ambulatory Visit
Admission: RE | Admit: 2020-05-21 | Discharge: 2020-05-21 | Disposition: A | Discharge status: Home / Self Care | Payer: Medicaid Other | Source: Ambulatory Visit | Attending: General Practice | Admitting: General Practice

## 2020-05-21 DIAGNOSIS — Z1231 Encounter for screening mammogram for malignant neoplasm of breast: Secondary | ICD-10-CM

## 2020-08-04 ENCOUNTER — Other Ambulatory Visit: Payer: Self-pay

## 2020-08-04 ENCOUNTER — Ambulatory Visit (INDEPENDENT_AMBULATORY_CARE_PROVIDER_SITE_OTHER): Payer: Medicare Other

## 2020-08-04 ENCOUNTER — Ambulatory Visit (INDEPENDENT_AMBULATORY_CARE_PROVIDER_SITE_OTHER): Payer: Medicare Other | Admitting: Podiatry

## 2020-08-04 ENCOUNTER — Encounter: Payer: Self-pay | Admitting: Podiatry

## 2020-08-04 ENCOUNTER — Other Ambulatory Visit: Payer: Self-pay | Admitting: Podiatry

## 2020-08-04 DIAGNOSIS — M7671 Peroneal tendinitis, right leg: Secondary | ICD-10-CM

## 2020-08-04 DIAGNOSIS — M779 Enthesopathy, unspecified: Secondary | ICD-10-CM

## 2020-08-04 MED ORDER — METHYLPREDNISOLONE 4 MG PO TBPK: ORAL_TABLET | ORAL | 0 refills | Status: AC

## 2020-08-04 NOTE — Patient Instructions (Addendum)
Look for Voltaren gel at the pharmacy over the counter or online (also known as diclofenac 1% gel). Apply to the painful areas 3-4x daily with the supplied dosing card. Allow to dry for 10 minutes before going into socks/shoes      Peroneal Tendinopathy Rehab Ask your health care provider which exercises are safe for you. Do exercises exactly as told by your health care provider and adjust them as directed. It is normal to feel mild stretching, pulling, tightness, or discomfort as you do these exercises. Stop right away if you feel sudden pain or your pain gets worse. Do not begin these exercises until told by your health care provider. Stretching and range-of-motion exercises These exercises warm up your muscles and joints and improve the movement and flexibility of your ankle. These exercises also help to relieve pain and stiffness. Gastroc and soleus stretch, standing  This is an exercise in which you stand on a step and use your body weight to stretch your calf muscles. To do this exercise: 1. Stand on the edge of a step on the ball of your left / right foot. The ball of your foot is on the walking surface, right under your toes. 2. Keep your other foot firmly on the same step. 3. Hold on to the wall, a railing, or a chair for balance. 4. Slowly lift your other foot, allowing your body weight to press your left / right heel down over the edge of the step. You should feel a stretch in your left / right calf (gastrocnemius and soleus). 5. Hold this position for 15 seconds. 6. Return both feet to the step. 7. Repeat this exercise with a slight bend in your left / right knee. Repeat 5 times with your left / right knee straight and 5 times with your left / right knee bent. Complete this exercise 2 times a day. Strengthening exercises These exercises build strength and endurance in your foot and ankle. Endurance is the ability to use your muscles for a long time, even after they get tired. Ankle  dorsiflexion with band   1. Secure a rubber exercise band or tube to an object, such as a table leg, that will not move when the band is pulled. 2. Secure the other end of the band around your left / right foot. 3. Sit on the floor, facing the object with your left / right leg extended. The band or tube should be slightly tense when your foot is relaxed. 4. Slowly flex your left / right ankle and toes to bring your foot toward you (dorsiflexion). 5. Hold this position for 15 seconds. 6. Let the band or tube slowly pull your foot back to the starting position. Repeat 5 times. Complete this exercise 2 times a day. Ankle eversion 1. Sit on the floor with your legs straight out in front of you. 2. Loop a rubber exercise band or tube around the ball of your left / right foot. The ball of your foot is on the walking surface, right under your toes. 3. Hold the ends of the band in your hands, or secure the band to a stable object. The band or tube should be slightly tense when your foot is relaxed. 4. Slowly push your foot outward, away from your other leg (eversion). 5. Hold this position for 15 seconds. 6. Slowly return your foot to the starting position. Repeat 5 times. Complete this exercise 2 times a day. Plantar flexion, standing  This exercise is sometimes  called standing heel raise. 1. Stand with your feet shoulder-width apart. 2. Place your hands on a wall or table to steady yourself as needed, but try not to use it for support. 3. Keep your weight spread evenly over the width of your feet while you slowly rise up on your toes (plantar flexion). If told by your health care provider: ? Shift your weight toward your left / right leg until you feel challenged. ? Stand on your left / right leg only. 4. Hold this position for 15 seconds. Repeat 2 times. Complete this exercise 2 times a day. Single leg stand 1. Without shoes, stand near a railing or in a doorway. You may hold on to the  railing or door frame as needed. 2. Stand on your left / right foot. Keep your big toe down on the floor and try to keep your arch lifted. ? Do not roll to the outside of your foot. ? If this exercise is too easy, you can try it with your eyes closed or while standing on a pillow. 3. Hold this position for 15 seconds. Repeat 5 times. Complete this exercise 2 times a day. This information is not intended to replace advice given to you by your health care provider. Make sure you discuss any questions you have with your health care provider. Document Revised: 12/05/2018 Document Reviewed: 12/05/2018 Elsevier Patient Education  [place].  Peroneal Tendinopathy  Peroneal tendinopathy is irritation of the tendons that pass behind your ankle (peroneal tendons). These tendons attach muscles in your foot to a bone on the side of your foot and underneath the arch of your foot. This condition can cause your peroneal tendons to get bigger and swell. What are the causes? This condition may be caused by:  Putting stress on your ankle over and over again (overuse injury).  A sudden injury that puts stress on your tendons, such as an ankle sprain. What increases the risk? You are more likely to develop this condition if you:  Have high arches.  Play sports that involve putting stress on the ankle over and over again. These sports include: ? Running. ? Dancing. ? Soccer. ? Basketball. What are the signs or symptoms? Symptoms of this condition can start suddenly or develop gradually. Symptoms of this condition include:  Pain in the back of the ankle, on the side of the foot, or in the arch of the foot.  Pain that gets worse with activity and better with rest.  Swelling.  Warmth.  Weakness in your foot or ankle. How is this diagnosed? This condition may be diagnosed based on:  Your symptoms.  Your medical history.  A physical exam. During the exam, your health care provider may  move your foot and ankle and test the strength of your leg muscles.  Imaging tests, such as: ? X-rays or a CT scan to check for bone injury. ? MRI or ultrasound to check for muscle or tendon injury. How is this treated? This condition may be treated by:  Keeping your body weight off your ankle for several days.  Returning to full activity gradually.  Putting ice on your ankle to reduce swelling.  Taking NSAIDs, such as ibuprofen.  Having medicine injected into your tendon to reduce swelling.  Wearing a removable boot or brace for ankle support.  Doing range-of-motion exercises and strengthening exercises (physical therapy) when pain and swelling improve. If the condition does not improve with treatment, or if a tendon or muscle  is damaged, surgery may be needed. Follow these instructions at home: If you have a boot or brace:  Wear the boot or brace as told by your health care provider. Remove it only as told by your health care provider.  Loosen the boot or brace if your toes tingle, become numb, or turn cold and blue.  Keep the boot or brace clean.  If the boot or brace is not waterproof: ? Do not let it get wet. ? Cover it with a watertight covering when you take a bath or shower. Managing pain, stiffness, and swelling   If directed, put ice on the injured area. ? If you have a removable boot or brace, remove it as told by your health care provider. ? Put ice in a plastic bag. ? Place a towel between your skin and the bag. ? Leave the ice on for 20 minutes, 2-3 times a day.  Move your toes often to reduce stiffness and swelling.  Raise (elevate) your ankle above the level of your heart while you are sitting or lying down. Activity  Do not do activities that make pain or swelling worse.  Do exercises as told by your health care provider.  Return to your normal activities as told by your health care provider. Ask your health care provider what activities are safe  for you.  Ask your health care provider when it is safe to drive if you have a boot or brace on your foot. General instructions  Take over-the-counter and prescription medicines only as told by your health care provider.  Do not use any products that contain nicotine or tobacco, such as cigarettes, e-cigarettes, and chewing tobacco. These can delay healing. If you need help quitting, ask your health care provider.  Keep all follow-up visits as told by your health care provider. This is important. How is this prevented?  Wear supportive footwear that is appropriate for your athletic activity.  Avoid athletic activities that cause swelling or pain in your ankle or foot.  See your health care provider if you have pain or swelling that does not improve after a few days of rest.  Stop training if you develop pain or swelling.  If you start a new athletic activity, start gradually to build up your strength, endurance, and flexibility.  Warm up and stretch before being active.  Cool down and stretch after being active. Contact a health care provider if:  Your symptoms get worse.  Your symptoms do not improve in 2-4 weeks.  You develop new, unexplained symptoms. Summary  Peroneal tendinopathy is irritation of the tendons that pass behind your ankle.  This condition is caused by overuse or sudden injury to the peroneal tendon.  Symptoms include pain, swelling, warmth, and weakness in your foot or ankle.  This condition is treated with rest, ice, medicines, physical therapy, and surgery if needed. This information is not intended to replace advice given to you by your health care provider. Make sure you discuss any questions you have with your health care provider. Document Revised: 12/07/2018 Document Reviewed: 09/25/2018 Elsevier Patient Education  [place].

## 2020-08-07 NOTE — Progress Notes (Signed)
  Subjective:  Patient ID: Kristy Baxter, female    DOB: 07/17/1951,  MRN: [id number]  Chief Complaint  Patient presents with  . Foot Pain    Patient presents today for right lateral side foot/arch pain x months    69 y.o. female presents with the above complaint. History confirmed with patient.  She is here with her daughter today.  The pain has been increasingly worsened extending from the base of the fifth metatarsal up into the outside of the ankle and leg.  Is worse with weightbearing.  Feels a throbbing.  Different shoes have made it worse.  Objective:  Physical Exam: warm, good capillary refill, no trophic changes or ulcerative lesions, normal DP and PT pulses and normal sensory exam.   Right Foot: Pain on palpation of the peroneal insertion of the fifth metatarsal base and along its course to the retromalleolar groove, no gross instability or peroneal dislocation is noted today    Radiographs: X-ray of the right foot: no fracture, dislocation, swelling or degenerative changes noted Assessment:   1. Peroneal tendinitis of right lower extremity      Plan:  Patient was evaluated and treated and all questions answered.  Discussed with her the etiology and treatment options for peroneal tendinopathy and tendinitis -Recommend stretching icing, RICE protocol reviewed, stretching exercises -She said issues with NSAIDs in the past and GI bleed so I would avoid a long course of these -Recommended short course of a methylprednisolone taper -Recommended topical treatment Voltaren gel 3-4 times daily -Tri-Lock ankle brace dispensed -Referral for physical therapy sent to focus on strengthening, range of motion and gait instability.  Return in about 6 weeks (around 09/15/2020) for re-check peroneal tendinitis.

## 2020-09-15 ENCOUNTER — Ambulatory Visit: Payer: Medicare Other | Admitting: Podiatry

## 2021-07-03 ENCOUNTER — Other Ambulatory Visit: Payer: Self-pay | Admitting: Student

## 2021-07-03 DIAGNOSIS — Z1231 Encounter for screening mammogram for malignant neoplasm of breast: Secondary | ICD-10-CM

## 2021-08-11 ENCOUNTER — Ambulatory Visit
Admission: RE | Admit: 2021-08-11 | Discharge: 2021-08-11 | Disposition: A | Discharge status: Home / Self Care | Payer: Medicare Other | Source: Ambulatory Visit | Attending: Student | Admitting: Student

## 2021-08-11 ENCOUNTER — Other Ambulatory Visit: Payer: Self-pay

## 2021-08-11 DIAGNOSIS — Z1231 Encounter for screening mammogram for malignant neoplasm of breast: Secondary | ICD-10-CM

## 2023-01-31 ENCOUNTER — Encounter: Payer: Self-pay | Admitting: Gastroenterology

## 2023-02-07 ENCOUNTER — Other Ambulatory Visit (HOSPITAL_COMMUNITY): Payer: Self-pay | Admitting: Student

## 2023-02-07 DIAGNOSIS — F17211 Nicotine dependence, cigarettes, in remission: Secondary | ICD-10-CM

## 2023-03-07 ENCOUNTER — Ambulatory Visit (HOSPITAL_COMMUNITY)
Admission: RE | Admit: 2023-03-07 | Discharge: 2023-03-07 | Disposition: A | Discharge status: Home / Self Care | Payer: Medicare Other | Source: Ambulatory Visit | Attending: Student | Admitting: Student

## 2023-03-07 DIAGNOSIS — F17211 Nicotine dependence, cigarettes, in remission: Secondary | ICD-10-CM | POA: Insufficient documentation

## 2023-10-30 IMAGING — MG MM DIGITAL SCREENING BILAT W/ TOMO AND CAD
8 series · 8 of 24 positions shown · non-contrast
Comparison: Previous exam(s).

CLINICAL DATA: Screening.

EXAM:
DIGITAL SCREENING BILATERAL MAMMOGRAM WITH TOMOSYNTHESIS AND CAD
TECHNIQUE: Bilateral screening digital craniocaudal and mediolateral oblique
mammograms were obtained. Bilateral screening digital breast
tomosynthesis was performed. The images were evaluated with
computer-aided detection.

[L CC synth-2D]
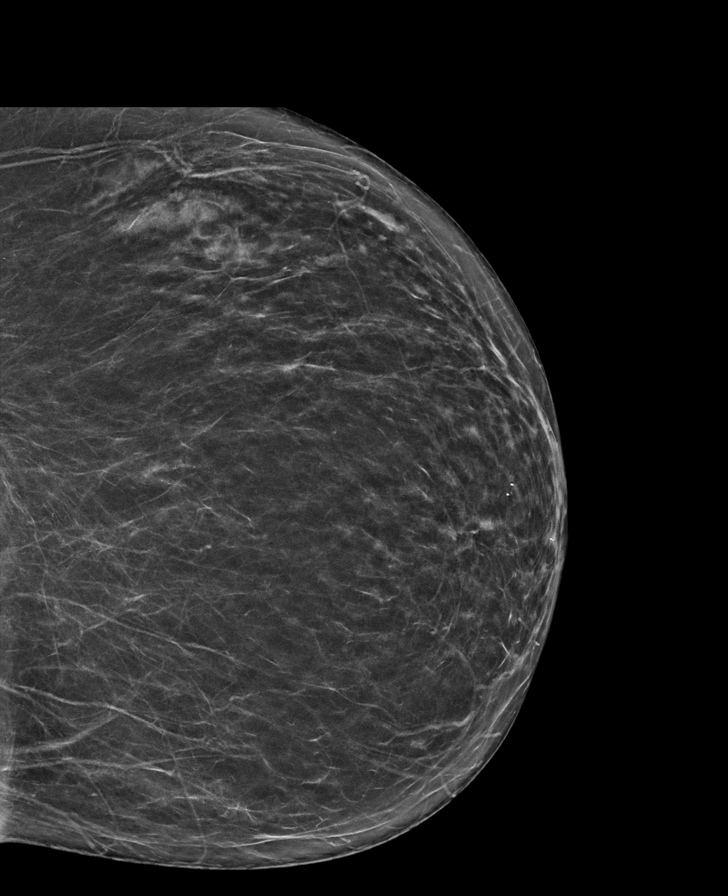

[L MLO synth-2D]
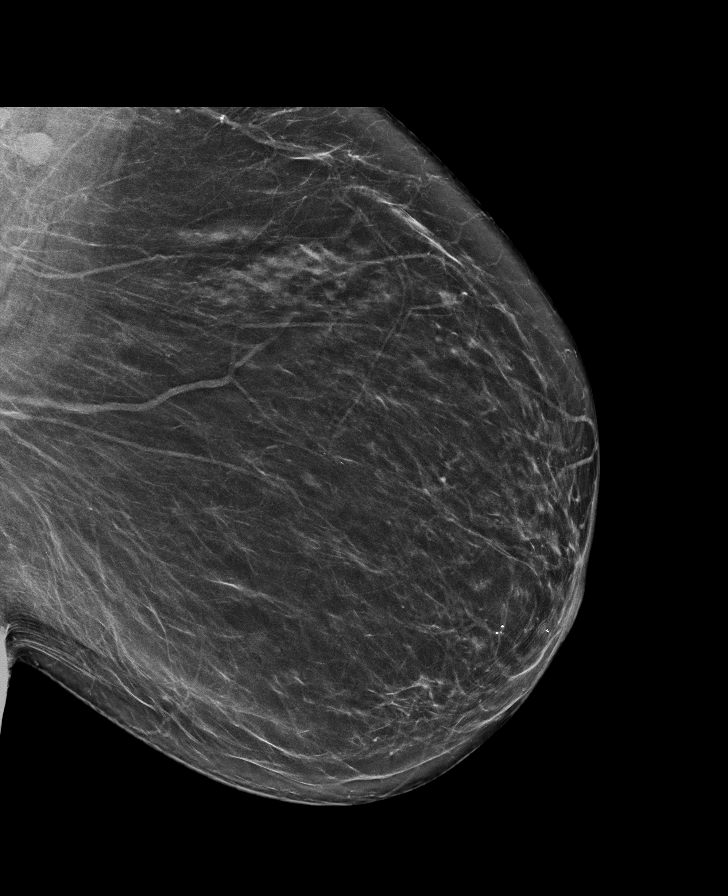

[R MLO synth-2D]
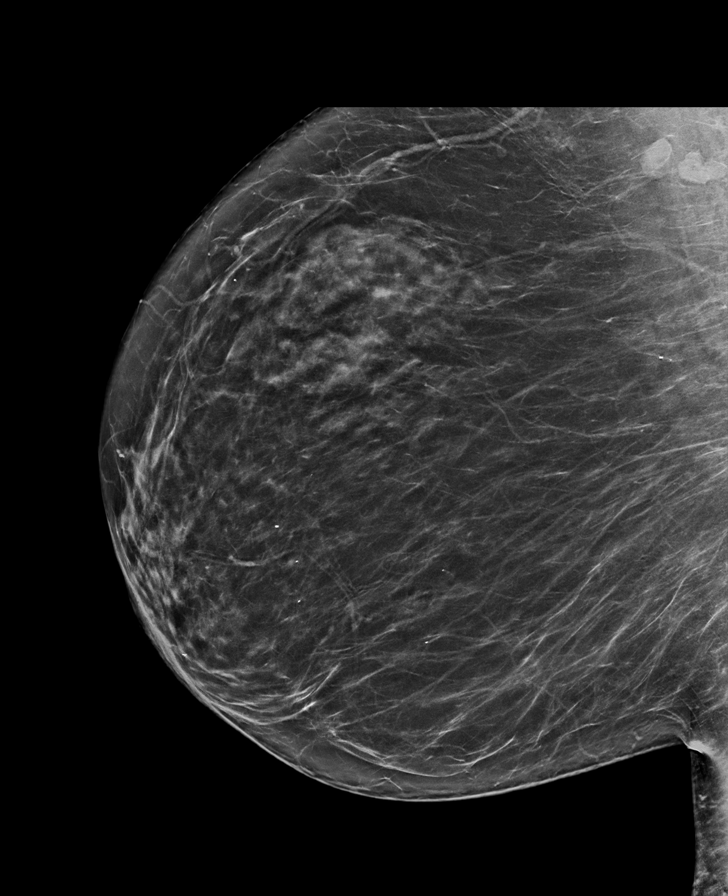

[R CC synth-2D]
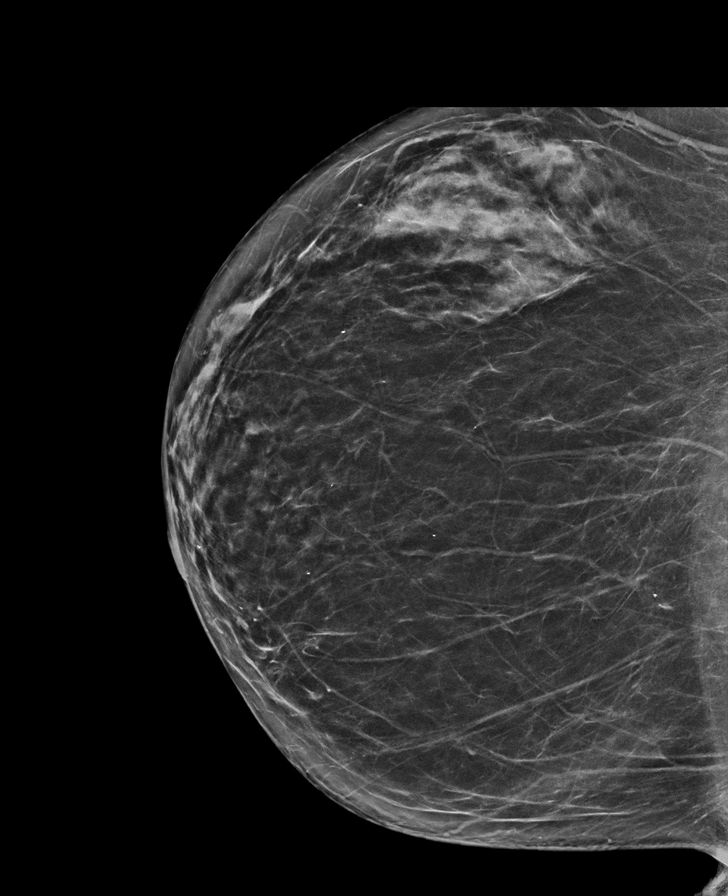

[R MLO tomo · tomo slice 39/78.0]
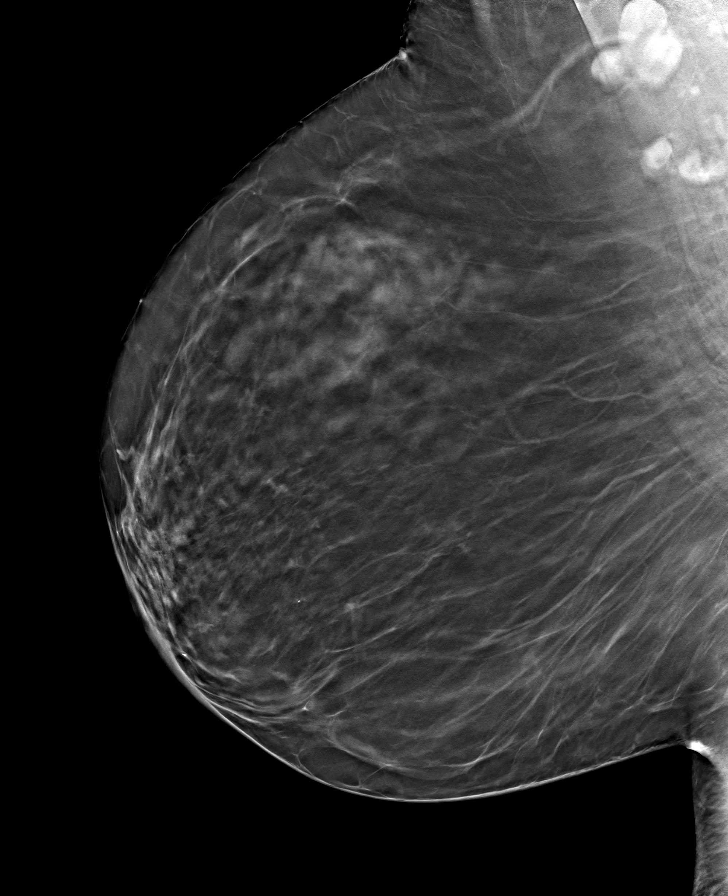

[L MLO tomo · tomo slice 37/72.0]
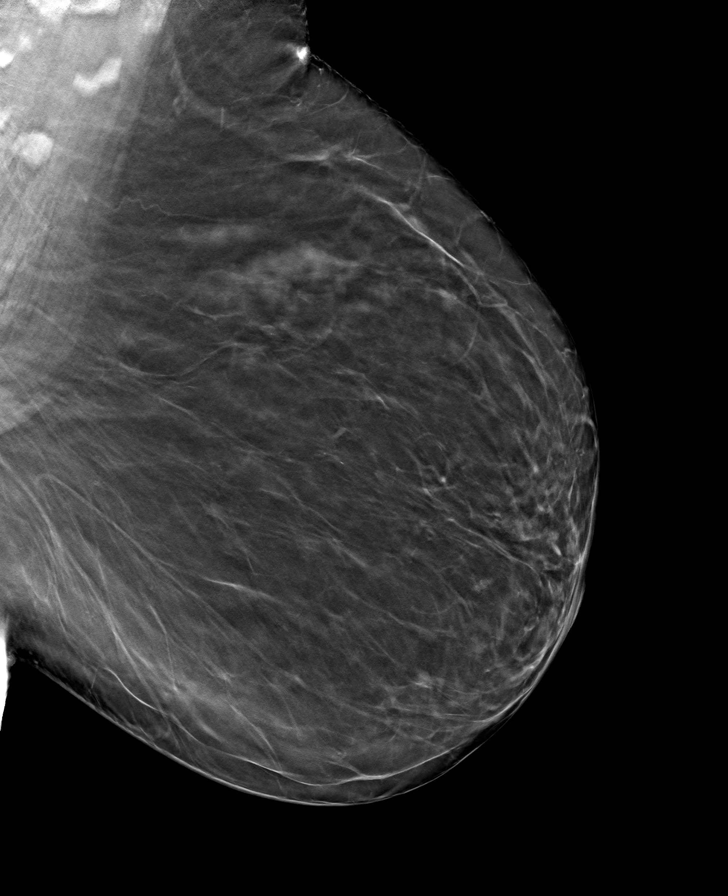

[R CC tomo · tomo slice 36/71.0]
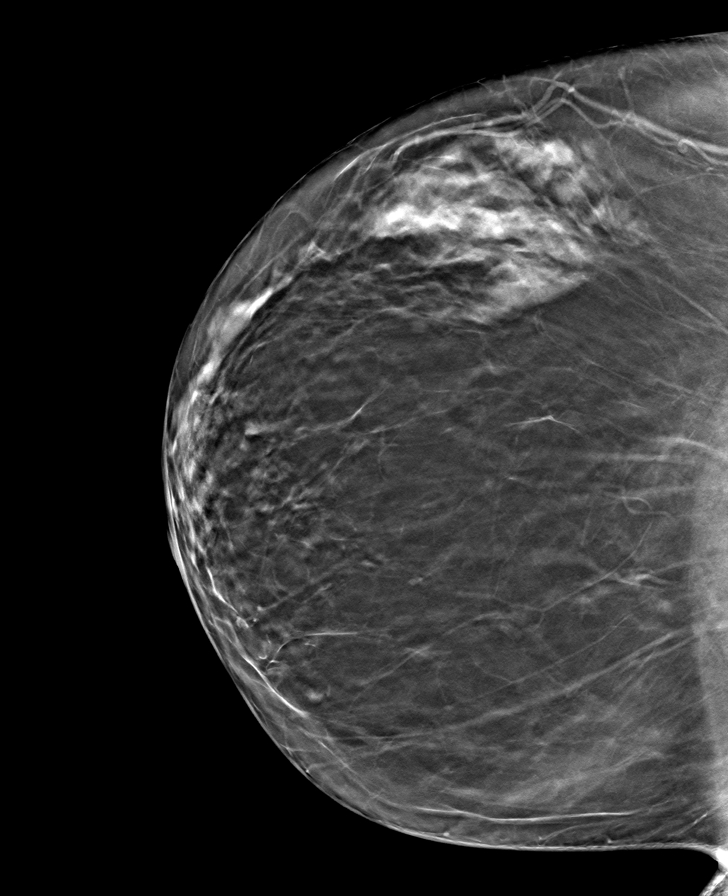

[L CC tomo · tomo slice 31/62.0]
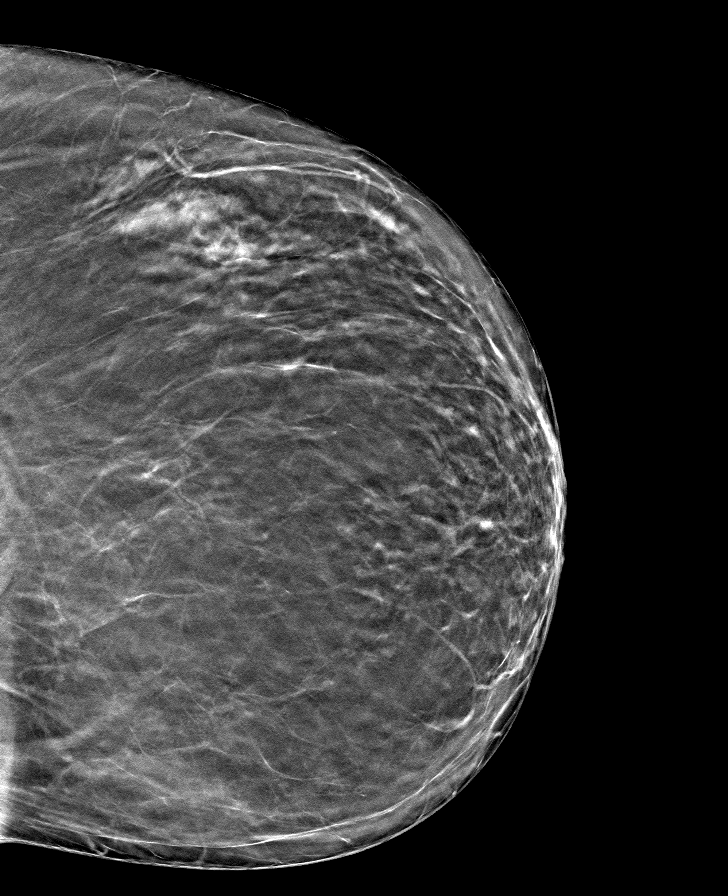

[8 of 24 positions shown; findings below may reference images not displayed]

ACR Breast Density Category b: There are scattered areas of
fibroglandular density.
FINDINGS: There are no findings suspicious for malignancy.
IMPRESSION: No mammographic evidence of malignancy. A result letter of this
screening mammogram will be mailed directly to the patient.

RECOMMENDATION:
Screening mammogram in one year. (Code:51-O-LD2)

BI-RADS CATEGORY  1: Negative.
